# Patient Record
Sex: Female | Born: 1945 | Race: White | Hispanic: Yes | Smoking: Former smoker
Health system: Southern US, Community
[De-identification: ages and names within clinical notes are randomized; demographics above are authoritative.]

## PROBLEM LIST (undated history)

## (undated) DIAGNOSIS — M1710 Unilateral primary osteoarthritis, unspecified knee: Secondary | ICD-10-CM

## (undated) DIAGNOSIS — M8580 Other specified disorders of bone density and structure, unspecified site: Secondary | ICD-10-CM

## (undated) DIAGNOSIS — I10 Essential (primary) hypertension: Secondary | ICD-10-CM

## (undated) DIAGNOSIS — M1990 Unspecified osteoarthritis, unspecified site: Secondary | ICD-10-CM

## (undated) DIAGNOSIS — T7840XA Allergy, unspecified, initial encounter: Secondary | ICD-10-CM

## (undated) DIAGNOSIS — M179 Osteoarthritis of knee, unspecified: Secondary | ICD-10-CM

## (undated) DIAGNOSIS — IMO0001 Reserved for inherently not codable concepts without codable children: Secondary | ICD-10-CM

## (undated) DIAGNOSIS — K7460 Unspecified cirrhosis of liver: Secondary | ICD-10-CM

## (undated) DIAGNOSIS — B1920 Unspecified viral hepatitis C without hepatic coma: Secondary | ICD-10-CM

## (undated) DIAGNOSIS — E119 Type 2 diabetes mellitus without complications: Secondary | ICD-10-CM

## (undated) DIAGNOSIS — K219 Gastro-esophageal reflux disease without esophagitis: Secondary | ICD-10-CM

## (undated) HISTORY — DX: Other specified disorders of bone density and structure, unspecified site: M85.80

## (undated) HISTORY — DX: Gastro-esophageal reflux disease without esophagitis: K21.9

## (undated) HISTORY — DX: Reserved for inherently not codable concepts without codable children: IMO0001

## (undated) HISTORY — DX: Osteoarthritis of knee, unspecified: M17.9

## (undated) HISTORY — DX: Unspecified viral hepatitis C without hepatic coma: B19.20

## (undated) HISTORY — PX: KNEE SURGERY: SHX244

## (undated) HISTORY — DX: Allergy, unspecified, initial encounter: T78.40XA

## (undated) HISTORY — DX: Type 2 diabetes mellitus without complications: E11.9

## (undated) HISTORY — DX: Unilateral primary osteoarthritis, unspecified knee: M17.10

## (undated) HISTORY — DX: Unspecified osteoarthritis, unspecified site: M19.90

---

## 2006-12-09 LAB — HM PAP SMEAR

## 2007-01-08 LAB — HM COLONOSCOPY

## 2007-07-11 LAB — HM MAMMOGRAPHY

## 2007-07-15 ENCOUNTER — Encounter: Admission: RE | Admit: 2007-07-15 | Discharge: 2007-08-13 | Payer: Self-pay | Admitting: Family Medicine

## 2007-08-20 ENCOUNTER — Ambulatory Visit: Payer: Self-pay | Admitting: Gastroenterology

## 2007-09-16 ENCOUNTER — Encounter: Admission: RE | Admit: 2007-09-16 | Discharge: 2007-09-16 | Payer: Self-pay | Admitting: Family Medicine

## 2008-06-28 ENCOUNTER — Ambulatory Visit: Payer: Self-pay | Admitting: Gastroenterology

## 2008-06-30 ENCOUNTER — Ambulatory Visit (HOSPITAL_COMMUNITY): Admission: RE | Admit: 2008-06-30 | Discharge: 2008-06-30 | Payer: Self-pay | Admitting: Gastroenterology

## 2009-06-04 ENCOUNTER — Emergency Department (HOSPITAL_COMMUNITY): Admission: EM | Admit: 2009-06-04 | Discharge: 2009-06-04 | Payer: Self-pay | Admitting: Emergency Medicine

## 2011-03-18 ENCOUNTER — Encounter: Payer: Self-pay | Admitting: Family Medicine

## 2011-03-18 DIAGNOSIS — K219 Gastro-esophageal reflux disease without esophagitis: Secondary | ICD-10-CM | POA: Insufficient documentation

## 2011-03-18 DIAGNOSIS — M1710 Unilateral primary osteoarthritis, unspecified knee: Secondary | ICD-10-CM | POA: Insufficient documentation

## 2011-03-18 DIAGNOSIS — T7840XA Allergy, unspecified, initial encounter: Secondary | ICD-10-CM | POA: Insufficient documentation

## 2011-03-18 DIAGNOSIS — M8580 Other specified disorders of bone density and structure, unspecified site: Secondary | ICD-10-CM | POA: Insufficient documentation

## 2011-03-18 DIAGNOSIS — B1920 Unspecified viral hepatitis C without hepatic coma: Secondary | ICD-10-CM | POA: Insufficient documentation

## 2011-03-18 DIAGNOSIS — E119 Type 2 diabetes mellitus without complications: Secondary | ICD-10-CM | POA: Insufficient documentation

## 2012-04-16 ENCOUNTER — Ambulatory Visit: Payer: PRIVATE HEALTH INSURANCE | Attending: Family Medicine | Admitting: Physical Therapy

## 2012-04-16 DIAGNOSIS — R293 Abnormal posture: Secondary | ICD-10-CM | POA: Insufficient documentation

## 2012-04-16 DIAGNOSIS — IMO0001 Reserved for inherently not codable concepts without codable children: Secondary | ICD-10-CM | POA: Insufficient documentation

## 2012-04-16 DIAGNOSIS — R262 Difficulty in walking, not elsewhere classified: Secondary | ICD-10-CM | POA: Insufficient documentation

## 2012-04-16 DIAGNOSIS — M545 Low back pain, unspecified: Secondary | ICD-10-CM | POA: Insufficient documentation

## 2012-04-16 DIAGNOSIS — M25569 Pain in unspecified knee: Secondary | ICD-10-CM | POA: Insufficient documentation

## 2012-04-28 ENCOUNTER — Ambulatory Visit: Payer: PRIVATE HEALTH INSURANCE | Admitting: Physical Therapy

## 2012-04-30 ENCOUNTER — Ambulatory Visit: Payer: PRIVATE HEALTH INSURANCE | Admitting: Physical Therapy

## 2012-05-05 ENCOUNTER — Encounter: Payer: PRIVATE HEALTH INSURANCE | Admitting: Physical Therapy

## 2012-05-07 ENCOUNTER — Encounter: Payer: PRIVATE HEALTH INSURANCE | Admitting: Physical Therapy

## 2012-07-07 ENCOUNTER — Emergency Department (HOSPITAL_COMMUNITY): Payer: PRIVATE HEALTH INSURANCE

## 2012-07-07 ENCOUNTER — Encounter (HOSPITAL_COMMUNITY): Payer: Self-pay | Admitting: Cardiology

## 2012-07-07 ENCOUNTER — Observation Stay (HOSPITAL_COMMUNITY)
Admission: EM | Admit: 2012-07-07 | Discharge: 2012-07-08 | Disposition: A | Discharge status: Home / Self Care | Payer: PRIVATE HEALTH INSURANCE | Attending: Emergency Medicine | Admitting: Emergency Medicine

## 2012-07-07 DIAGNOSIS — R079 Chest pain, unspecified: Principal | ICD-10-CM | POA: Insufficient documentation

## 2012-07-07 DIAGNOSIS — B1920 Unspecified viral hepatitis C without hepatic coma: Secondary | ICD-10-CM | POA: Insufficient documentation

## 2012-07-07 DIAGNOSIS — Z7982 Long term (current) use of aspirin: Secondary | ICD-10-CM | POA: Insufficient documentation

## 2012-07-07 DIAGNOSIS — J309 Allergic rhinitis, unspecified: Secondary | ICD-10-CM | POA: Insufficient documentation

## 2012-07-07 DIAGNOSIS — I10 Essential (primary) hypertension: Secondary | ICD-10-CM | POA: Insufficient documentation

## 2012-07-07 DIAGNOSIS — F17200 Nicotine dependence, unspecified, uncomplicated: Secondary | ICD-10-CM | POA: Insufficient documentation

## 2012-07-07 DIAGNOSIS — M1710 Unilateral primary osteoarthritis, unspecified knee: Secondary | ICD-10-CM | POA: Insufficient documentation

## 2012-07-07 DIAGNOSIS — Z79899 Other long term (current) drug therapy: Secondary | ICD-10-CM | POA: Insufficient documentation

## 2012-07-07 DIAGNOSIS — M899 Disorder of bone, unspecified: Secondary | ICD-10-CM | POA: Insufficient documentation

## 2012-07-07 DIAGNOSIS — K219 Gastro-esophageal reflux disease without esophagitis: Secondary | ICD-10-CM | POA: Insufficient documentation

## 2012-07-07 DIAGNOSIS — IMO0002 Reserved for concepts with insufficient information to code with codable children: Secondary | ICD-10-CM | POA: Insufficient documentation

## 2012-07-07 DIAGNOSIS — E119 Type 2 diabetes mellitus without complications: Secondary | ICD-10-CM | POA: Insufficient documentation

## 2012-07-07 HISTORY — DX: Essential (primary) hypertension: I10

## 2012-07-07 LAB — POCT I-STAT TROPONIN I
Troponin i, poc: 0.03 ng/mL (ref 0.00–0.08)
Troponin i, poc: 0.01 ng/mL (ref 0.00–0.08)

## 2012-07-07 LAB — CBC WITH DIFFERENTIAL/PLATELET
Basophils Absolute: 0 10*3/uL (ref 0.0–0.1)
Basophils Relative: 0 % (ref 0–1)
Eosinophils Absolute: 0.1 10*3/uL (ref 0.0–0.7)
Eosinophils Relative: 2 % (ref 0–5)
HCT: 43.2 % (ref 36.0–46.0)
Hemoglobin: 14.6 g/dL (ref 12.0–15.0)
Lymphocytes Relative: 36 % (ref 12–46)
Lymphs Abs: 2.4 10*3/uL (ref 0.7–4.0)
MCH: 31.6 pg (ref 26.0–34.0)
MCHC: 33.8 g/dL (ref 30.0–36.0)
MCV: 93.5 fL (ref 78.0–100.0)
Monocytes Absolute: 0.5 10*3/uL (ref 0.1–1.0)
Monocytes Relative: 8 % (ref 3–12)
Neutro Abs: 3.5 10*3/uL (ref 1.7–7.7)
Neutrophils Relative %: 54 % (ref 43–77)
Platelets: 139 10*3/uL — ABNORMAL LOW (ref 150–400)
RBC: 4.62 MIL/uL (ref 3.87–5.11)
RDW: 13.7 % (ref 11.5–15.5)
WBC: 6.6 10*3/uL (ref 4.0–10.5)

## 2012-07-07 LAB — BASIC METABOLIC PANEL
BUN: 8 mg/dL (ref 6–23)
CO2: 29 mEq/L (ref 19–32)
Calcium: 9.6 mg/dL (ref 8.4–10.5)
Chloride: 101 mEq/L (ref 96–112)
Creatinine, Ser: 0.67 mg/dL (ref 0.50–1.10)
GFR calc Af Amer: >90 mL/min (ref >90)
GFR calc non Af Amer: >90 mL/min (ref >90)
Glucose, Bld: 162 mg/dL — ABNORMAL HIGH (ref 70–99)
Potassium: 4.5 mEq/L (ref 3.5–5.1)
Sodium: 137 mEq/L (ref 135–145)

## 2012-07-07 LAB — TROPONIN I: Troponin I: <0.3 ng/mL (ref <0.30)

## 2012-07-07 MED ORDER — PANTOPRAZOLE SODIUM 40 MG PO TBEC: 40.0000 mg | DELAYED_RELEASE_TABLET | Freq: Every day | ORAL | Status: DC

## 2012-07-07 MED ORDER — LISINOPRIL 10 MG PO TABS: 10.0000 mg | ORAL_TABLET | Freq: Every day | ORAL | Status: DC

## 2012-07-07 MED ORDER — OXYBUTYNIN CHLORIDE ER 5 MG PO TB24: 5.0000 mg | ORAL_TABLET | Freq: Every day | ORAL | Status: DC

## 2012-07-07 MED ORDER — LORATADINE 10 MG PO TABS: 10.0000 mg | ORAL_TABLET | Freq: Every day | ORAL | Status: DC

## 2012-07-07 MED ORDER — DULOXETINE HCL 60 MG PO CPEP: 60.0000 mg | ORAL_CAPSULE | Freq: Every day | ORAL | Status: DC

## 2012-07-07 MED ORDER — SODIUM CHLORIDE 0.9 % IV SOLN
20.0000 mL | INTRAVENOUS | Status: DC
  Administered 2012-07-07: 20 mL via INTRAVENOUS

## 2012-07-07 MED ORDER — ASPIRIN 81 MG PO CHEW
324.0000 mg | CHEWABLE_TABLET | Freq: Once | ORAL | Status: AC
  Administered 2012-07-07: 243 mg via ORAL
  Filled 2012-07-07: qty 3

## 2012-07-07 NOTE — ED Notes (Signed)
Called the IV team to start IV.

## 2012-07-07 NOTE — ED Provider Notes (Signed)
Patient placed in CDU under chest pain protocol.  Patient resting in bed with family at bedside.  Patient reports awakening at 0400 this am with substernal chest pain with radiation to left arm.  Patient continues to report aching pain to left arm, but is currently chest pain free.  Lungs CTA bilaterally.  S1/S2, RRR, no murmur noted.  Abdomen soft, bowel sounds present.  Strong distal pulses palpated all extremities.  Monitor reveals NSR without ectopy.  12 lead reviewed, no indication of ischemia.  Cardiac markers within normal limits.  Patient is scheduled for coronary CT in AM.  Treatment and diagnostic plan discussed with patient.  Jimmye Norman, NP 07/07/12 [phone]

## 2012-07-07 NOTE — ED Notes (Signed)
Pt given turkey sandwich and sprite.  

## 2012-07-07 NOTE — ED Provider Notes (Addendum)
History    66 year old female with chest pain. Woke her from sleep at approximately 4:00 this morning. Substernal ache which lasted about 5 minutes and slowly resolved without intervention. Associated with an achy feeling in her left arm. This sensation in her arm has persisted since then. No appreciable exacerbating/relieving factors. No shortness of breath. No palpitations or diaphoresis. No history similar symptoms. No unusual leg pain or swelling. No known history of coronary disease. Patient self support no previous provocative testing. Has never seen a cardiologist. History of hypertension, diabetes, and obesity. Patient is a smoker. No unusual leg pain or swelling. No fevers or chills. No cough.  CSN: [id number]  Arrival date & time 07/07/12  1548   First MD Initiated Contact with Patient 07/07/12 1634      Chief Complaint  Patient presents with  . Chest Pain    (Consider location/radiation/quality/duration/timing/severity/associated sxs/prior treatment) HPI  Past Medical History  Diagnosis Date  . GERD (gastroesophageal reflux disease)   . Allergy     Rhinitis  . Hepatitis C   . Osteopenia   . NIDDM (non-insulin dependent diabetes mellitus)   . Osteoarthritis of knee     right knee  . Hypertension     Past Surgical History  Procedure Date  . Knee surgery     History reviewed. No pertinent family history.  History  Substance Use Topics  . Smoking status: Current Some Day Smoker  . Smokeless tobacco: Not on file  . Alcohol Use: No    OB History    Grav Para Term Preterm Abortions TAB SAB Ect Mult Living                  Review of Systems   Review of symptoms negative unless otherwise noted in HPI.   Allergies  Review of patient's allergies indicates no known allergies.  Home Medications   Current Outpatient Rx  Name Route Sig Dispense Refill  . ASPIRIN 81 MG PO TABS Oral Take 81 mg by mouth daily.      . DULOXETINE HCL 60 MG PO CPEP Oral Take 60  mg by mouth daily.      Marland Kitchen LISINOPRIL 10 MG PO TABS Oral Take 10 mg by mouth daily.    Marland Kitchen LORATADINE 10 MG PO TABS Oral Take 10 mg by mouth daily.      Marland Kitchen OMEPRAZOLE 20 MG PO CPDR Oral Take 20 mg by mouth daily.      . OXYBUTYNIN CHLORIDE ER 5 MG PO TB24 Oral Take 5 mg by mouth daily.      BP 129/92  Pulse 99  Temp 98.9 F (37.2 C) (Oral)  Resp 16  Ht 5' (1.524 m)  Wt 176 lb (79.833 kg)  BMI 34.37 kg/m2  SpO2 99%  Physical Exam  Nursing note and vitals reviewed. Constitutional: She appears well-developed and well-nourished. No distress.       Sitting in bed. NAD. Obese.  HENT:  Head: Normocephalic and atraumatic.  Eyes: Conjunctivae normal are normal. Right eye exhibits no discharge. Left eye exhibits no discharge.  Neck: Neck supple.  Cardiovascular: Normal rate, regular rhythm and normal heart sounds.  Exam reveals no gallop and no friction rub.   No murmur heard. Pulmonary/Chest: Effort normal and breath sounds normal. No respiratory distress. She exhibits no tenderness.  Abdominal: Soft. She exhibits no distension. There is no tenderness.  Musculoskeletal: She exhibits no edema and no tenderness.       Lower extremities symmetric as compared  to each other. No calf tenderness. Negative Homan's. No palpable cords.   Neurological: She is alert.  Skin: Skin is warm and dry. She is not diaphoretic.  Psychiatric: She has a normal mood and affect. Her behavior is normal. Thought content normal.    ED Course  Procedures (including critical care time)  Labs Reviewed - No data to display Dg Chest 2 View  07/07/2012  *RADIOLOGY REPORT*  Clinical Data: Chest pain  CHEST - 2 VIEW  Comparison: None.  Findings: Probable small calcified granuloma in the left upper lobe. Lungs clear.  Heart size and pulmonary vascularity normal. No effusion.  Visualized bones unremarkable.  IMPRESSION: No acute disease   Original Report Authenticated By: Thora Lance III, M.D.     EKG:  Rhythm:  sinus tachycardia Vent. rate 102 BPM PR interval 120 ms QRS duration 70 ms QT/QTc 330/430 ms Axis: normal ST segments: NS ST changes inferiorly and laterally Comparison: none   1. Chest pain       MDM  66yF with CP and L arm pain. Arm pain possible anginal equivalent but atypical given constant duration since 0400 today. EKG with nonspecific changes inferiorly and laterally. Initial troponin is normal. Pt with no known CAD but no previous formal evaluation. Multiple risk factors for CAD including smoking, HTN, DM and obesity. Feel low enough risk for CP protocol. BMI 34.4. CT coronaries in am assuming rules out enzymatically. Discussed with Felicie Morn, PA, in CDU.        Raeford Razor, MD 07/07/12 [id number]  Raeford Razor, MD 07/08/12 0030

## 2012-07-07 NOTE — ED Notes (Signed)
Family at bedside. 

## 2012-07-07 NOTE — ED Notes (Signed)
Pt to department with c/o chest pain that happened last night and radiated into her left arm. States the nurse that comes to check on her was worried about possible stroke, but pt denies any neuro deficits or symptoms at all. Reports pain in her left arm at this time.

## 2012-07-08 ENCOUNTER — Observation Stay (HOSPITAL_COMMUNITY): Payer: PRIVATE HEALTH INSURANCE

## 2012-07-08 LAB — POCT I-STAT TROPONIN I: Troponin i, poc: 0.01 ng/mL (ref 0.00–0.08)

## 2012-07-08 IMAGING — CT CT HEART MORP W/ CTA COR W/ SCORE W/ CA W/CM &/OR W/O CM
1 series · 14 of 20 positions shown, 18 images · IV contrast (omnipaque)
Comparison: No priors.

INDICATION: 66-year-old female with history of chest pain.

CT ANGIOGRAPHY OF THE HEART, CORONARY ARTERY, STRUCTURE, AND
MORPHOLOGY
CONTRAST:  80 ml of Omnipaque 350.
TECHNIQUE: CT angiography of the coronary vessels was performed on
a 256 channel system using prospective ECG gating.  A scout and
noncontrast exam (for calcium scoring) were performed.  Circulation
time was measured using a test bolus.  Coronary CTA was performed
with sub mm slice collimation during portions of the cardiac cycle
after prior injection of iodinated contrast.  Imaging post
processing was performed on an independent workstation creating
multiplanar and 3-D images, and quantitative analysis of the heart
and coronary arteries.  Note that this exam targets the heart and
the chest was not imaged in its entirety.
PREMEDICATION:
Lopressor 200 mg, P.O.
Nitroglycerin 400 mcg, sublingual.

[Series 335: ccta · 14 of 24 slices shown, 18 images]
[im 2/24  vessel]
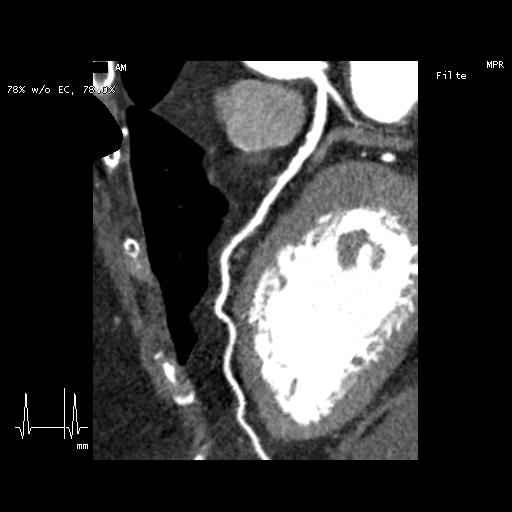
[im 2/24  lung]
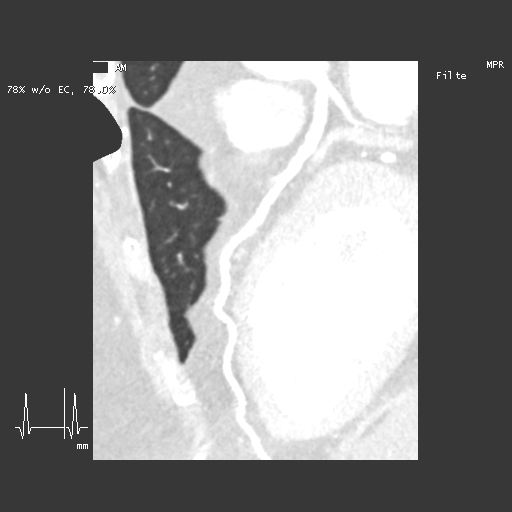
[im 3/24  vessel]
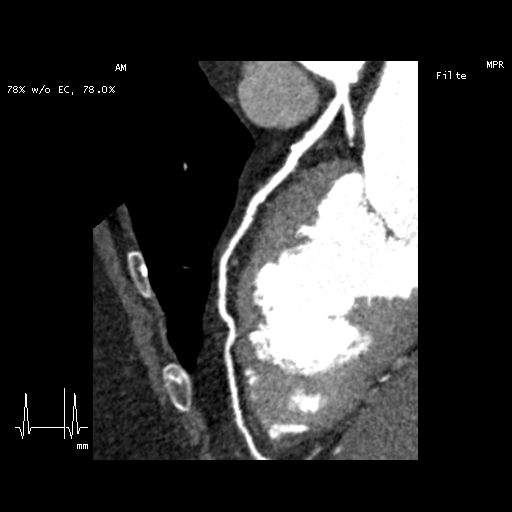
[im 5/24  vessel]
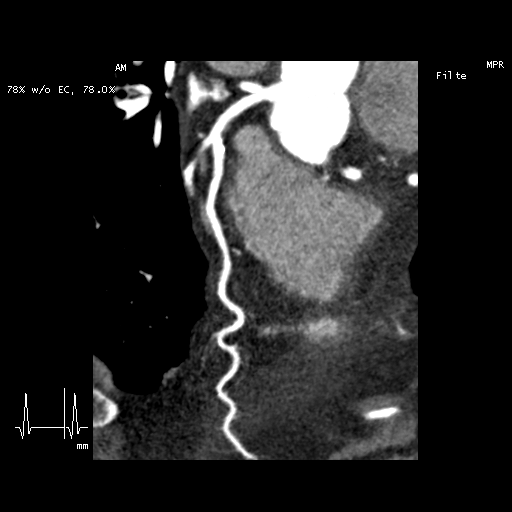
[im 7/24  vessel]
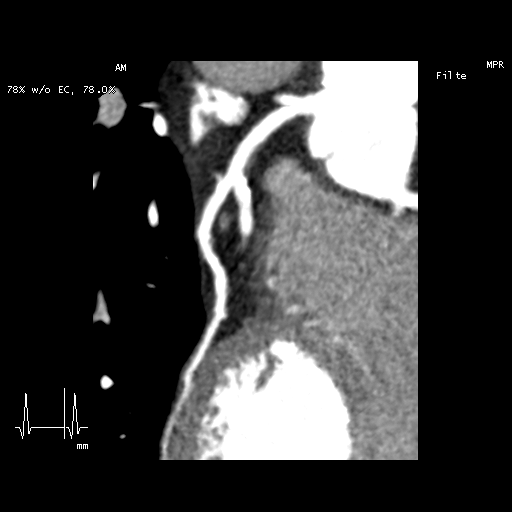
[im 8/24  vessel]
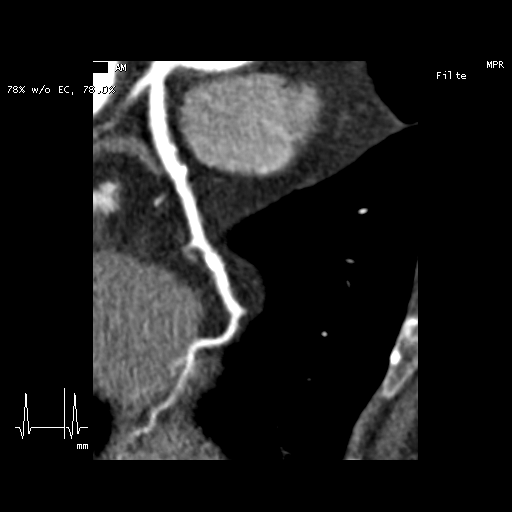
[im 8/24  lung]
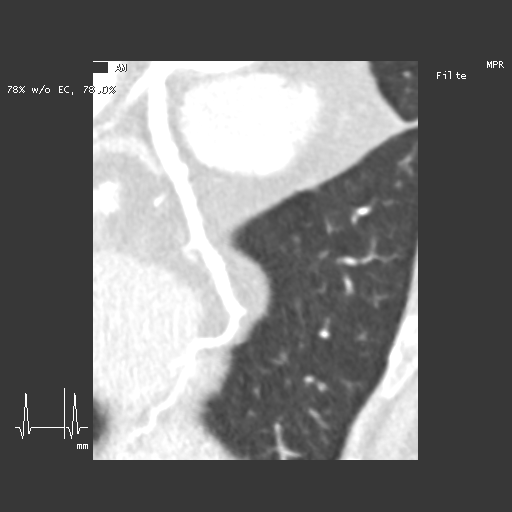
[im 10/24  vessel]
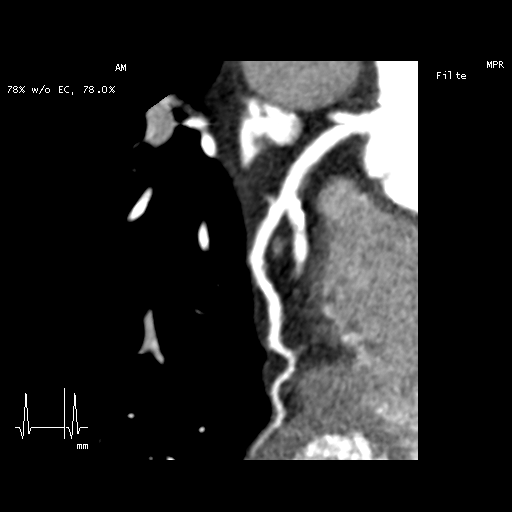
[im 11/24  vessel]
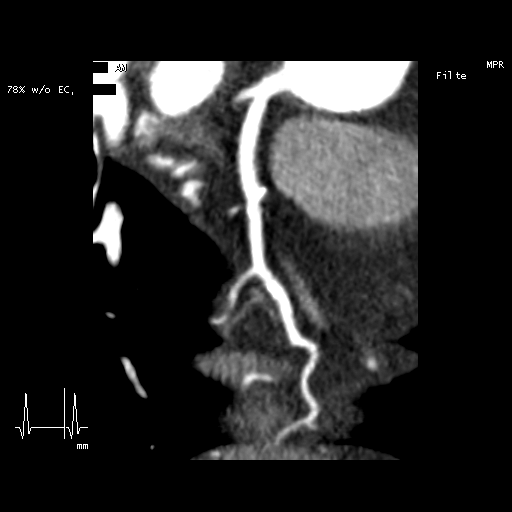
[im 13/24  vessel]
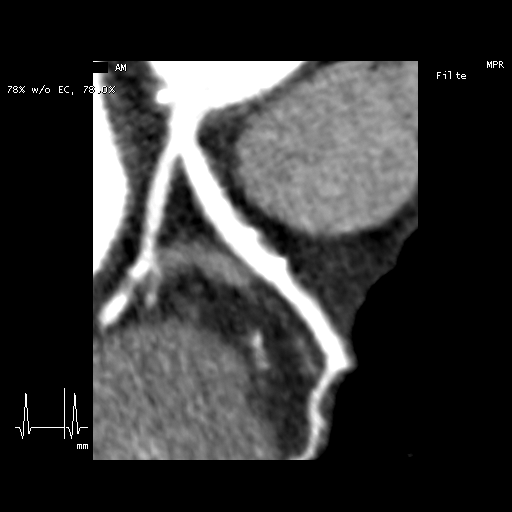
[im 14/24  vessel]
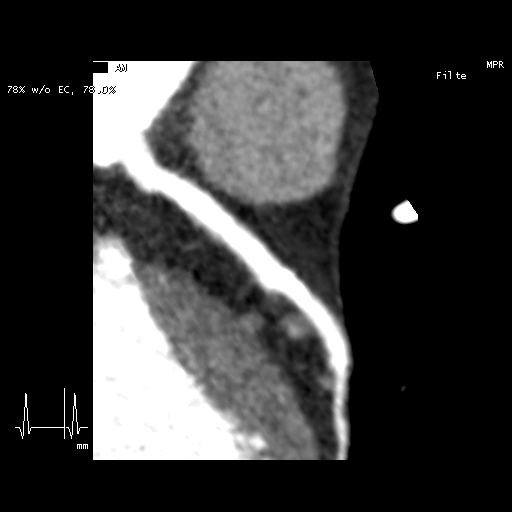
[im 14/24  lung]
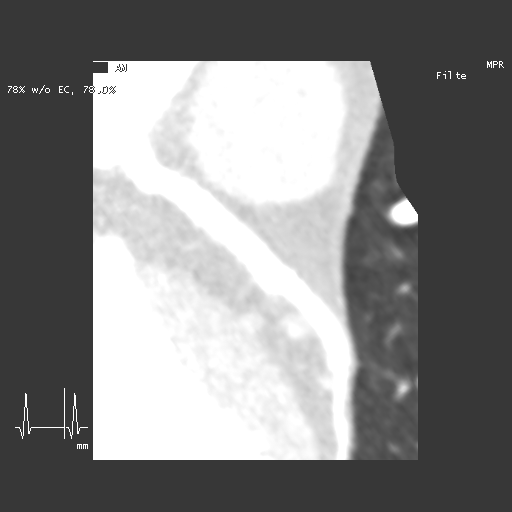
[im 16/24  vessel]
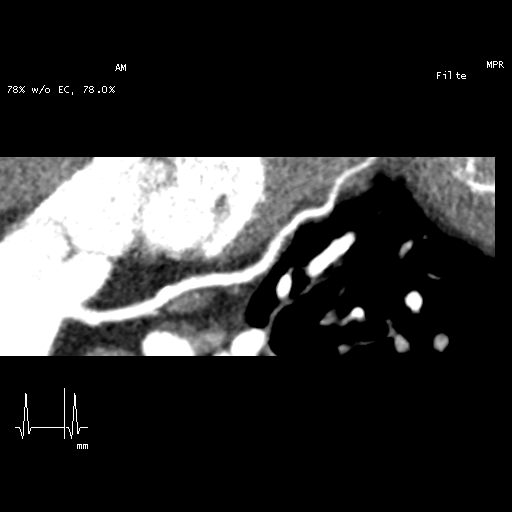
[im 17/24  vessel]
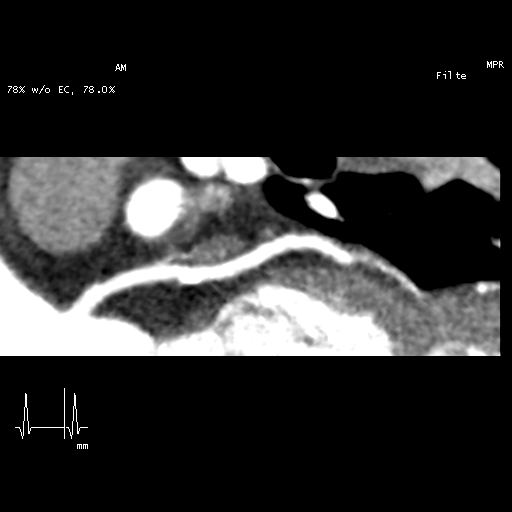
[im 19/24  vessel]
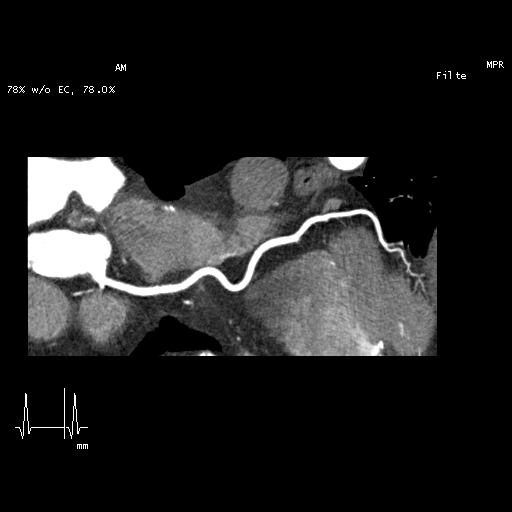
[im 21/24  vessel]
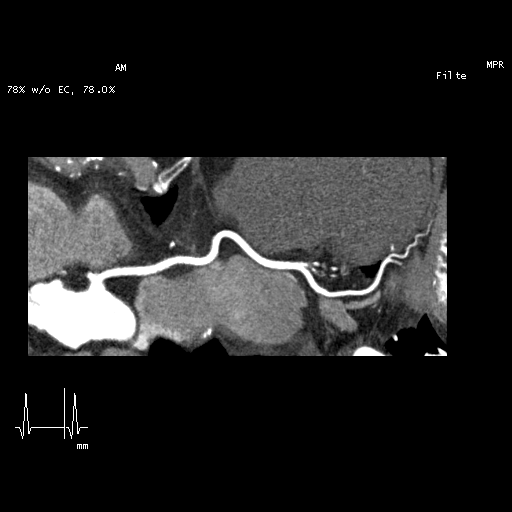
[im 21/24  lung]
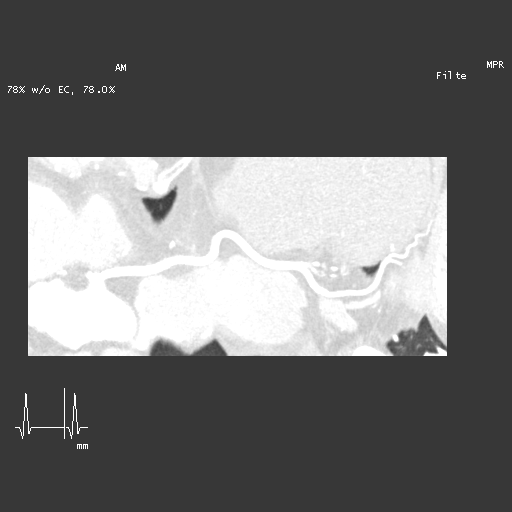
[im 22/24  vessel]
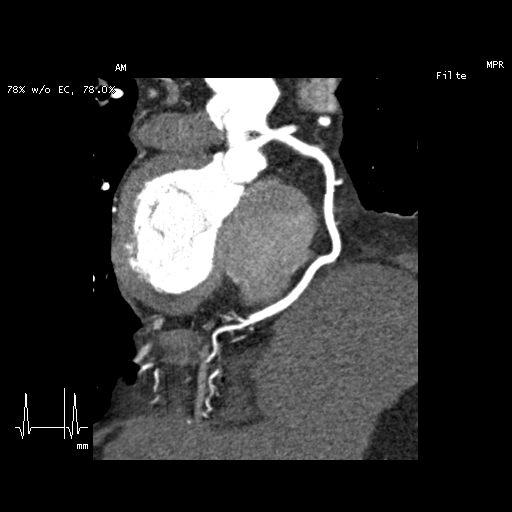

[14 of 20 positions shown; findings below may reference images not displayed]

FINDINGS: Technical quality:  Excellent

Heart rate:  45

CORONARY ARTERIES:
Left main coronary artery:  Negative.
Left anterior descending:  Mildly diseased with mixed
atherosclerotic plaque resulting in up to 25-50% percent stenosis
in the mid LAD just distal to the first septal perforator.
Diagonals: Large diagonal 1, negative for atherosclerosis.
Left circumflex:  Minimal calcified plaque proximally with 0-25%
stenosis.
Obtuse Marginals: Small OM1 and large OM2, both negative for
atherosclerosis.
Right coronary artery:  Negative.
Posterior descending artery:  Negative.
Dominance:  Right.

CORONARY CALCIUM:
Total Agatston Score:  160
[HOSPITAL] percentile:  92nd

AORTA AND PULMONARY MEASUREMENTS:
Aortic root (21 - 40 mm):
            20 mm  at the annulus
            26 mm  at the sinuses of Valsalva
            22 mm  at the sinotubular junction
Ascending aorta ( <  40 mm):  29 mm

Descending aorta ( <  40 mm):  21 mm
Main pulmonary artery:  ( <  30 mm):  25 mm

EXTRACARDIAC FINDINGS:
Linear opacities in the lower lobes bilaterally, inferior segment
of the lingula and right middle lobe are compatible with areas of
subsegmental atelectasis and/or scarring.  Within the visualized
thorax there is no acute consolidative air space disease, no
pleural effusions, and no suspicious-appearing pulmonary nodules or
masses. There is no significant pericardial fluid, thickening or
pericardial calcification.  Visualized portions of the upper
abdomen are remarkable for a shrunken appearance and nodular
contour of the liver, consistent with cirrhosis. There are no
aggressive appearing lytic or blastic lesions noted in the
visualized portions of the skeleton.
IMPRESSION: 1.  Nonobstructive coronary artery disease, as detailed above.  The
patient's total coronary artery calcium score is 160, which is 92nd
percentile for patient's of matched age, gender and race/ethnicity.
2.  No acute findings in the visualized thorax that of the
patient's symptoms.
3.  Multifocal areas of subsegmental atelectasis and/or scarring in
the visualized lungs bilaterally, as above.
4.  Cirrhosis.
5.  Right coronary artery dominance.

07/08/2012.

## 2012-07-08 MED ORDER — METOPROLOL TARTRATE 1 MG/ML IV SOLN
INTRAVENOUS | Status: AC
  Filled 2012-07-08: qty 10

## 2012-07-08 MED ORDER — METOPROLOL TARTRATE 1 MG/ML IV SOLN: 5.0000 mg | Freq: Once | INTRAVENOUS | Status: DC

## 2012-07-08 MED ORDER — IOHEXOL 350 MG/ML SOLN
80.0000 mL | Freq: Once | INTRAVENOUS | Status: AC | PRN
  Administered 2012-07-08: 80 mL via INTRAVENOUS

## 2012-07-08 MED ORDER — METOPROLOL TARTRATE 25 MG PO TABS
100.0000 mg | ORAL_TABLET | Freq: Once | ORAL | Status: AC
  Administered 2012-07-08: 100 mg via ORAL
  Filled 2012-07-08: qty 4

## 2012-07-08 MED ORDER — NITROGLYCERIN 0.4 MG SL SUBL
0.4000 mg | SUBLINGUAL_TABLET | Freq: Once | SUBLINGUAL | Status: AC
  Administered 2012-07-08: 0.4 mg via SUBLINGUAL

## 2012-07-08 MED ORDER — METOPROLOL TARTRATE 1 MG/ML IV SOLN
INTRAVENOUS | Status: AC
  Filled 2012-07-08: qty 5

## 2012-07-08 MED ORDER — METOPROLOL TARTRATE 25 MG PO TABS
100.0000 mg | ORAL_TABLET | Freq: Once | ORAL | Status: AC
  Administered 2012-07-08: 100 mg via ORAL
  Filled 2012-07-08: qty 1
  Filled 2012-07-08: qty 3

## 2012-07-08 NOTE — ED Notes (Signed)
Pt taken to CT by RN for CTA

## 2012-07-08 NOTE — ED Notes (Signed)
Per Dr Rulon Abide hold metoprolol for CTA  due to pressure, CT notified

## 2012-07-08 NOTE — ED Notes (Signed)
NAD noted at time of d/c home with family 

## 2012-07-08 NOTE — ED Notes (Signed)
Pt returned from CT and on monitor

## 2012-07-08 NOTE — ED Provider Notes (Signed)
Patient moved to CDU under chest pain protocol. Patient resting comfortably at present without return of chest pain. Lungs CTA bilaterally. S1/S2, RRR, no murmur. Abdomen soft, bowel sounds present. Strong distal pulses palpated all extremities. Sinus rhythm on monitor without ectopy. Troponin negative x 2. 12 lead reviewed, no indication of ischemia. Patient scheduled for CT coronary this AM. Diagnostic and treatment plan discussed with patient.  8:00AM  Ca score of 160, non obstructing LAD: Mildly diseased with mixed atherosclerotic plaque resulting in up to 25-50% percent stenosis in the mid LAD just distal to the first septal perforator.  Of Note: Visualized portions of the upper abdomen are remarkable for a shrunken appearance and nodular contour of the liver, consistent with cirrhosis  12:00PM  Pt to be d/c home with outpatient cardiology f/u.  I have discussed these findings with the patient.  Patient is to be discharged with recommendation to follow up with PCP in regards to today's hospital visit. Pts symptoms unlikely to be of CAD etiology and pt has not reported any CP while in my care in the CDU. Labs and imaging reviewed again prior to dc. CXR and ECG with no acute abnormalities, CTangio without evidence of obstructing lesion as noted above, and neg troponins x3. Pt has been advised return to the ED if develop any exertional CP- strict return precautions discussed & patient's questions answered. Pt appears reliable for follow up and is agreeable to discharge.   Dahlia Client Orris Perin, PA-C 07/08/12 1219

## 2012-07-08 NOTE — ED Provider Notes (Signed)
Medical screening examination/treatment/procedure(s) were performed by non-physician practitioner and as supervising physician I was immediately available for consultation/collaboration.  Gerhard Munch, MD 07/08/12 [phone]

## 2012-07-08 NOTE — ED Notes (Signed)
Pt notified that she is now NPO, drinks removed from room.

## 2012-07-08 NOTE — ED Notes (Signed)
Pt ambulatory to bathroom

## 2012-07-09 NOTE — ED Provider Notes (Signed)
Medical screening examination/treatment/procedure(s) were performed by non-physician practitioner and as supervising physician I was immediately available for consultation/collaboration.  Vang Kraeger, MD 07/09/12 0010 

## 2012-07-16 ENCOUNTER — Other Ambulatory Visit: Payer: Self-pay | Admitting: Cardiovascular Disease

## 2012-07-16 DIAGNOSIS — M79602 Pain in left arm: Secondary | ICD-10-CM

## 2012-07-20 ENCOUNTER — Inpatient Hospital Stay: Admission: RE | Admit: 2012-07-20 | Payer: PRIVATE HEALTH INSURANCE | Source: Ambulatory Visit

## 2012-08-18 ENCOUNTER — Other Ambulatory Visit: Payer: Self-pay | Admitting: Nurse Practitioner

## 2012-08-18 DIAGNOSIS — M542 Cervicalgia: Secondary | ICD-10-CM

## 2012-08-23 ENCOUNTER — Other Ambulatory Visit: Payer: PRIVATE HEALTH INSURANCE

## 2012-08-27 ENCOUNTER — Other Ambulatory Visit (HOSPITAL_COMMUNITY): Payer: Self-pay | Admitting: Cardiovascular Disease

## 2012-08-27 DIAGNOSIS — R079 Chest pain, unspecified: Secondary | ICD-10-CM

## 2012-09-09 HISTORY — PX: NM MYOCAR PERF WALL MOTION: HXRAD629

## 2012-09-09 HISTORY — PX: TRANSTHORACIC ECHOCARDIOGRAM: SHX275

## 2012-09-16 ENCOUNTER — Ambulatory Visit (HOSPITAL_COMMUNITY)
Admission: RE | Admit: 2012-09-16 | Discharge: 2012-09-16 | Disposition: A | Discharge status: Home / Self Care | Payer: PRIVATE HEALTH INSURANCE | Source: Ambulatory Visit | Attending: Cardiovascular Disease | Admitting: Cardiovascular Disease

## 2012-09-16 DIAGNOSIS — R079 Chest pain, unspecified: Secondary | ICD-10-CM

## 2012-09-16 DIAGNOSIS — R42 Dizziness and giddiness: Secondary | ICD-10-CM | POA: Insufficient documentation

## 2012-09-16 DIAGNOSIS — E119 Type 2 diabetes mellitus without complications: Secondary | ICD-10-CM | POA: Insufficient documentation

## 2012-09-16 DIAGNOSIS — I10 Essential (primary) hypertension: Secondary | ICD-10-CM | POA: Insufficient documentation

## 2012-09-16 MED ORDER — TECHNETIUM TC 99M SESTAMIBI GENERIC - CARDIOLITE
30.2000 | Freq: Once | INTRAVENOUS | Status: AC | PRN
  Administered 2012-09-16: 30.2 via INTRAVENOUS

## 2012-09-16 MED ORDER — TECHNETIUM TC 99M SESTAMIBI GENERIC - CARDIOLITE
11.0000 | Freq: Once | INTRAVENOUS | Status: AC | PRN
  Administered 2012-09-16: 11 via INTRAVENOUS

## 2012-09-16 MED ORDER — REGADENOSON 0.4 MG/5ML IV SOLN
0.4000 mg | Freq: Once | INTRAVENOUS | Status: AC
  Administered 2012-09-16: 0.4 mg via INTRAVENOUS

## 2012-09-16 NOTE — Progress Notes (Signed)
[facility]   2D echo completed 09/16/2012.   Cindy Caitlyne Ingham, RDCS   

## 2012-09-16 NOTE — Procedures (Addendum)
[facility] [address] [place] [address] [phone]  Cardiology Nuclear Med Study  Stacy Richard is a 67 y.o. female     MRN : [id number]     DOB: 1946-02-01  Procedure Date: 09/16/2012  Nuclear Med Background Indication for Stress Test:  Evaluation for Ischemia History:  no prior cardiac history Cardiac Risk Factors: Family History - CAD, History of Smoking, Hypertension, NIDDM and Obesity  Symptoms:  Chest Pain and Dizziness   Nuclear Pre-Procedure Caffeine/Decaff Intake:  12:00am NPO After: 10:00AM   IV Site: R Antecubital  IV 0.9% NS with Angio Cath:  22g  Chest Size (in):  n/a IV Started by: Stacy Richard, CNMT  Height: 5' (1.524 m)  Cup Size: C  BMI:  Body mass index is 35.74 kg/(m^2). Weight:  183 lb (83.008 kg)   Tech Comments:  n/a    Nuclear Med Study 1 or 2 day study: 1 day  Stress Test Type:  Lexiscan  Order Authorizing Provider:  Susa Griffins, MD   Resting Radionuclide: Technetium 3m Sestamibi  Resting Radionuclide Dose: 11.0 mCi   Stress Radionuclide:  Technetium 6m Sestamibi  Stress Radionuclide Dose: 30.2 mCi           Stress Protocol Rest HR: 62 Stress HR:82  Rest BP: 129/85 Stress BP: 110/71  Exercise Time (min): n/a METS: n/a          Dose of Adenosine (mg):  n/a Dose of Lexiscan: 0.4 mg  Dose of Atropine (mg): n/a Dose of Dobutamine: n/a mcg/kg/min (at max HR)  Stress Test Technologist: Stacy Richard, CCT Nuclear Technologist: Stacy Richard, CNMT   Rest Procedure:  Myocardial perfusion imaging was performed at rest 45 minutes following the intravenous administration of Technetium 82m Sestamibi. Stress Procedure:  The patient received IV Lexiscan 0.4 mg over 15-seconds.  Technetium 88m Sestamibi injected at 30-seconds.  There were no significant changes with Lexiscan.  Quantitative spect images were obtained after a 45 minute delay.  Transient Ischemic Dilatation (Normal  <1.22):  1.11 Lung/Heart Ratio (Normal <0.45):  0.29 QGS EDV:  51 ml QGS ESV:  14 ml LV Ejection Fraction: 72%  Signed by     Rest ECG: NSR - Normal EKG  Stress ECG: No significant change from baseline ECG  QPS Raw Data Images:  Normal; no motion artifact; normal heart/lung ratio. Stress Images:  Normal homogeneous uptake with minimal breast attenuation. Rest Images:  Comparison with the stress images reveals no significant change. Subtraction (SDS):  Normal  Impression Exercise Capacity:  Lexiscan with no exercise. BP Response:  Normal blood pressure response. Clinical Symptoms:  No significant symptoms noted. ECG Impression:  No significant ST segment change suggestive of ischemia. Comparison with Prior Nuclear Study: No previous nuclear study performed  Overall Impression:  Normal stress nuclear study. Low risk stress nuclear study.  LV Wall Motion:  NL LV Function, EF 72%; NL Wall Motion   [name],[name], MD  09/16/2012 5:17 PM

## 2012-10-30 ENCOUNTER — Other Ambulatory Visit: Payer: Self-pay | Admitting: *Deleted

## 2012-10-30 ENCOUNTER — Other Ambulatory Visit: Payer: Self-pay | Admitting: Cardiovascular Disease

## 2012-10-30 ENCOUNTER — Other Ambulatory Visit: Payer: Self-pay | Admitting: Internal Medicine

## 2012-10-30 ENCOUNTER — Ambulatory Visit
Admission: RE | Admit: 2012-10-30 | Discharge: 2012-10-30 | Disposition: A | Discharge status: Home / Self Care | Payer: PRIVATE HEALTH INSURANCE | Source: Ambulatory Visit | Attending: *Deleted | Admitting: *Deleted

## 2012-10-30 DIAGNOSIS — B182 Chronic viral hepatitis C: Secondary | ICD-10-CM

## 2012-11-02 ENCOUNTER — Other Ambulatory Visit: Payer: Self-pay | Admitting: Internal Medicine

## 2012-11-02 DIAGNOSIS — B182 Chronic viral hepatitis C: Secondary | ICD-10-CM

## 2013-01-05 ENCOUNTER — Encounter: Payer: Self-pay | Admitting: Cardiovascular Disease

## 2013-02-15 ENCOUNTER — Other Ambulatory Visit: Payer: Self-pay | Admitting: Internal Medicine

## 2013-02-15 DIAGNOSIS — C220 Liver cell carcinoma: Secondary | ICD-10-CM

## 2013-02-19 ENCOUNTER — Ambulatory Visit
Admission: RE | Admit: 2013-02-19 | Discharge: 2013-02-19 | Disposition: A | Discharge status: Home / Self Care | Payer: PRIVATE HEALTH INSURANCE | Source: Ambulatory Visit | Attending: Internal Medicine | Admitting: Internal Medicine

## 2013-02-19 DIAGNOSIS — C220 Liver cell carcinoma: Secondary | ICD-10-CM

## 2013-02-19 MED ORDER — IOHEXOL 300 MG/ML  SOLN
125.0000 mL | Freq: Once | INTRAMUSCULAR | Status: AC | PRN
  Administered 2013-02-19: 125 mL via INTRAVENOUS

## 2013-05-17 ENCOUNTER — Other Ambulatory Visit: Payer: Self-pay | Admitting: Physician Assistant

## 2013-05-17 DIAGNOSIS — E2839 Other primary ovarian failure: Secondary | ICD-10-CM

## 2013-07-06 ENCOUNTER — Other Ambulatory Visit: Payer: Self-pay | Admitting: Physician Assistant

## 2013-07-06 DIAGNOSIS — N6489 Other specified disorders of breast: Secondary | ICD-10-CM

## 2013-07-22 ENCOUNTER — Ambulatory Visit
Admission: RE | Admit: 2013-07-22 | Discharge: 2013-07-22 | Disposition: A | Discharge status: Home / Self Care | Payer: PRIVATE HEALTH INSURANCE | Source: Ambulatory Visit | Attending: Physician Assistant | Admitting: Physician Assistant

## 2013-07-22 DIAGNOSIS — N6489 Other specified disorders of breast: Secondary | ICD-10-CM

## 2013-08-09 ENCOUNTER — Other Ambulatory Visit: Payer: Self-pay | Admitting: *Deleted

## 2013-08-09 MED ORDER — PANTOPRAZOLE SODIUM 40 MG PO TBEC: 40.0000 mg | DELAYED_RELEASE_TABLET | Freq: Every day | ORAL | Status: DC

## 2013-10-07 ENCOUNTER — Other Ambulatory Visit: Payer: PRIVATE HEALTH INSURANCE

## 2013-10-13 ENCOUNTER — Other Ambulatory Visit: Payer: Self-pay | Admitting: *Deleted

## 2013-10-13 MED ORDER — TRIAMTERENE-HCTZ 37.5-25 MG PO CAPS: 1.0000 | ORAL_CAPSULE | Freq: Every day | ORAL | Status: DC

## 2013-10-13 NOTE — Telephone Encounter (Signed)
Left VM for patient to clarify medication.   Was not taking benzapril 20mg  at last OV with Dr. Rollene Fare in 09/2012 Lisinopril was increased to 20mg  daily at this visit it appears

## 2013-10-13 NOTE — Telephone Encounter (Signed)
Dyazide 37.5-25 refills #30 with 1 refill

## 2013-10-15 ENCOUNTER — Ambulatory Visit
Admission: RE | Admit: 2013-10-15 | Discharge: 2013-10-15 | Disposition: A | Discharge status: Home / Self Care | Payer: PRIVATE HEALTH INSURANCE | Source: Ambulatory Visit | Attending: Physician Assistant | Admitting: Physician Assistant

## 2013-10-15 DIAGNOSIS — E2839 Other primary ovarian failure: Secondary | ICD-10-CM

## 2013-10-18 NOTE — Telephone Encounter (Signed)
Spoke with patient to clarify medications.   Per last OV note in 09/2012 with Dr. Rollene Fare Lisinopril was increased to 20mg  daily.   Per patient, has been taking Lisinopril 20mg  and Benazepril 20mg  daily (refill request was for Benazepril)  RN spoke with Erasmo Downer, PharmD on what to do with refill and she recommended not refilled Benazepril and having patient take 2 - 20mg  tablets of Lisinopril and make OV.   RN informed patient of this information. Patient agreeable. Message sent to scheduler to set up [place].

## 2013-10-19 ENCOUNTER — Other Ambulatory Visit: Payer: Self-pay | Admitting: Internal Medicine

## 2013-10-19 DIAGNOSIS — B182 Chronic viral hepatitis C: Secondary | ICD-10-CM

## 2013-10-21 ENCOUNTER — Ambulatory Visit
Admission: RE | Admit: 2013-10-21 | Discharge: 2013-10-21 | Disposition: A | Discharge status: Home / Self Care | Payer: PRIVATE HEALTH INSURANCE | Source: Ambulatory Visit | Attending: Internal Medicine | Admitting: Internal Medicine

## 2013-10-21 DIAGNOSIS — B182 Chronic viral hepatitis C: Secondary | ICD-10-CM

## 2013-10-21 NOTE — Telephone Encounter (Signed)
Entry in error

## 2013-10-28 ENCOUNTER — Encounter: Payer: Self-pay | Admitting: *Deleted

## 2013-10-28 ENCOUNTER — Other Ambulatory Visit: Payer: Self-pay | Admitting: Internal Medicine

## 2013-10-28 DIAGNOSIS — K769 Liver disease, unspecified: Secondary | ICD-10-CM

## 2013-10-29 ENCOUNTER — Ambulatory Visit (INDEPENDENT_AMBULATORY_CARE_PROVIDER_SITE_OTHER): Payer: PRIVATE HEALTH INSURANCE | Admitting: Internal Medicine

## 2013-10-29 ENCOUNTER — Encounter: Payer: Self-pay | Admitting: Internal Medicine

## 2013-10-29 VITALS — BP 142/82 | HR 93 | Ht 59.0 in | Wt 175.5 lb

## 2013-10-29 DIAGNOSIS — B1920 Unspecified viral hepatitis C without hepatic coma: Secondary | ICD-10-CM

## 2013-10-29 DIAGNOSIS — I2510 Atherosclerotic heart disease of native coronary artery without angina pectoris: Secondary | ICD-10-CM

## 2013-10-29 DIAGNOSIS — I10 Essential (primary) hypertension: Secondary | ICD-10-CM

## 2013-10-29 DIAGNOSIS — E119 Type 2 diabetes mellitus without complications: Secondary | ICD-10-CM

## 2013-10-29 MED ORDER — METOPROLOL TARTRATE 25 MG PO TABS: 12.5000 mg | ORAL_TABLET | Freq: Two times a day (BID) | ORAL | Status: DC

## 2013-10-29 NOTE — Progress Notes (Signed)
OFFICE NOTE  Chief Complaint:  No complaints, New cardiologist  Primary Care Physician: Chesley Noon, MD  HPI:  Stacy Richard is a pleasant 68 year old female previously followed by Dr. Rollene Fare. She is here to establish new cardiac care. She also has a history of hepatitis C who is contemplating treatment given the new medications available. She also has some cirrhosis of the liver and possibly may have a malignancy. She scheduled for a CT and possibly biopsy later this week. She does have a history of hypertension and coronary artery disease. In 2013 she had a CT angiogram which showed a calcium score of 160, with LAD and circumflex calcium that was nonobstructive. She has been taking lisinopril for blood pressure as well as enalapril mistakenly.  She is asymptomatic and denies any chest pain or shortness of breath. Her heart rate is noted to be elevated. She has diabetes as well.  PMHx:  Past Medical History  Diagnosis Date  . GERD (gastroesophageal reflux disease)   . Allergy     Rhinitis  . Hepatitis C   . Osteopenia   . NIDDM (non-insulin dependent diabetes mellitus)   . Osteoarthritis of knee     right knee  . Hypertension   . Arthritis     left hip    Past Surgical History  Procedure Laterality Date  . Knee surgery    . Transthoracic echocardiogram  09/2012    EF 55-54%, mild LVH & mild conc hypertrophy, grade 1 diastolic dysfunction; mild MR  . Nm myocar perf wall motion  09/2012    lexiscan myoview; normal study, low risk    FAMHx:  Family History  Problem Relation Age of Onset  . Heart failure Mother   . Hepatitis Mother   . Kidney cancer Mother   . Heart disease Father   . Heart Problems Sister     murmur  . Diabetes type II Sister   . Heart Problems Daughter     murmur    SOCHx:   reports that she quit smoking about 4 years ago. She has never used smokeless tobacco. She reports that she uses illicit drugs (Marijuana). She reports that she does  not drink alcohol.  ALLERGIES:  No Known Allergies  ROS: A comprehensive review of systems was negative except for: Hepatitis C  HOME MEDS: Current Outpatient Prescriptions  Medication Sig Dispense Refill  . Artificial Tear Ointment (ARTIFICIAL TEARS) ointment as needed.      Marland Kitchen aspirin 81 MG tablet Take 81 mg by mouth daily.        . Biotin (BIOTIN 5000) 5 MG CAPS Take 1 capsule by mouth daily.      . Cholecalciferol (VITAMIN D-3) 1000 UNITS CAPS Take 1 capsule by mouth daily.      Marland Kitchen desonide (DESOWEN) 0.05 % lotion Apply 1 application topically 2 (two) times daily as needed.      . DULoxetine (CYMBALTA) 60 MG capsule Take 60 mg by mouth daily.        Marland Kitchen erythromycin ophthalmic ointment Place 1 application into both eyes at bedtime.      Marland Kitchen lisinopril (PRINIVIL,ZESTRIL) 20 MG tablet Take 40 mg by mouth daily.      . meloxicam (MOBIC) 15 MG tablet Take 15 mg by mouth daily.      . Omega-3 Fatty Acids (FISH OIL) 1000 MG CAPS Take 1 capsule by mouth daily.      Marland Kitchen oxybutynin (DITROPAN-XL) 5 MG 24 hr tablet Take 5 mg  by mouth daily.      . pantoprazole (PROTONIX) 40 MG tablet Take 1 tablet (40 mg total) by mouth daily.  30 tablet  3  . Polyethyl Glycol-Propyl Glycol (SYSTANE OP) Apply to eye daily.      Marland Kitchen triamterene-hydrochlorothiazide (DYAZIDE) 37.5-25 MG per capsule Take 1 capsule by mouth daily.       . metoprolol tartrate (LOPRESSOR) 25 MG tablet Take 0.5 tablets (12.5 mg total) by mouth 2 (two) times daily.  30 tablet  6   No current facility-administered medications for this visit.    LABS/IMAGING: No results found for this or any previous visit (from the past 48 hour(s)). No results found.  VITALS: BP 142/82  Pulse 93  Ht 4\' 11"  (1.499 m)  Wt 175 lb 8 oz (79.606 kg)  BMI 35.43 kg/m2  EXAM: General appearance: alert and no distress Neck: no carotid bruit, no JVD and thyroid not enlarged, symmetric, no tenderness/mass/nodules Lungs: clear to auscultation bilaterally Heart:  regular rate and rhythm, S1, S2 normal, no murmur, click, rub or gallop Abdomen: soft, non-tender; bowel sounds normal; no masses,  no organomegaly Extremities: extremities normal, atraumatic, no cyanosis or edema Pulses: 2+ and symmetric Skin: Skin color, texture, turgor normal. No rashes or lesions Neurologic: Grossly normal Psych: Mood, affect normal  EKG: Normal sinus rhythm at 93  ASSESSMENT: 1. Mild to moderate nonobstructive coronary disease 2. Hypertension 3. Tachycardia 4. Diabetes type 2 5. Hepatitis C  PLAN: 1.   Stacy Richard has mild to moderate nonobstructive coronary disease. Her hypertension is fairly well controlled. Unfortunately she was taking 2 different ACE inhibitors by mistake. We've corrected that and recommended she take lisinopril 40 mg daily. She should also be on a beta blocker given her history of coronary disease and persistent tachycardia. I recommended metoprolol tartrate 12.5 milligrams twice daily. I've encouraged her to continue to followup about her hepatitis and further liver checks. We'll see her back in 6 months.  Pixie Casino, MD, [facility] Attending Cardiologist [facility]  Wednesday Ericsson C 10/29/2013, 4:13 PM

## 2013-10-29 NOTE — Patient Instructions (Signed)
Your physician has recommended you make the following change in your medication: START metoprolol tartrate 12.5mg twice daily.   Your physician wants you to follow-up in: 6 months.  You will receive a reminder letter in the mail two months in advance. If you don't receive a letter, please call our office to schedule the follow-up appointment.   

## 2013-11-01 ENCOUNTER — Other Ambulatory Visit: Payer: Self-pay | Admitting: *Deleted

## 2013-11-03 ENCOUNTER — Ambulatory Visit
Admission: RE | Admit: 2013-11-03 | Discharge: 2013-11-03 | Disposition: A | Discharge status: Home / Self Care | Payer: PRIVATE HEALTH INSURANCE | Source: Ambulatory Visit | Attending: Internal Medicine | Admitting: Internal Medicine

## 2013-11-03 DIAGNOSIS — K769 Liver disease, unspecified: Secondary | ICD-10-CM

## 2013-11-03 MED ORDER — IOHEXOL 300 MG/ML  SOLN
100.0000 mL | Freq: Once | INTRAMUSCULAR | Status: AC | PRN
  Administered 2013-11-03: 100 mL via INTRAVENOUS

## 2013-11-05 ENCOUNTER — Other Ambulatory Visit: Payer: Self-pay | Admitting: *Deleted

## 2013-11-05 MED ORDER — LISINOPRIL 40 MG PO TABS: 40.0000 mg | ORAL_TABLET | Freq: Every day | ORAL | Status: DC

## 2013-12-10 ENCOUNTER — Other Ambulatory Visit: Payer: Self-pay | Admitting: Cardiovascular Disease

## 2013-12-10 NOTE — Telephone Encounter (Signed)
Rx was sent to pharmacy electronically. 

## 2013-12-19 IMAGING — CR DG CHEST 2V
2 series · 2 of 2 positions shown · non-contrast
Comparison: None.

CLINICAL DATA: Chest pain

CHEST - 2 VIEW

[w chest pa]
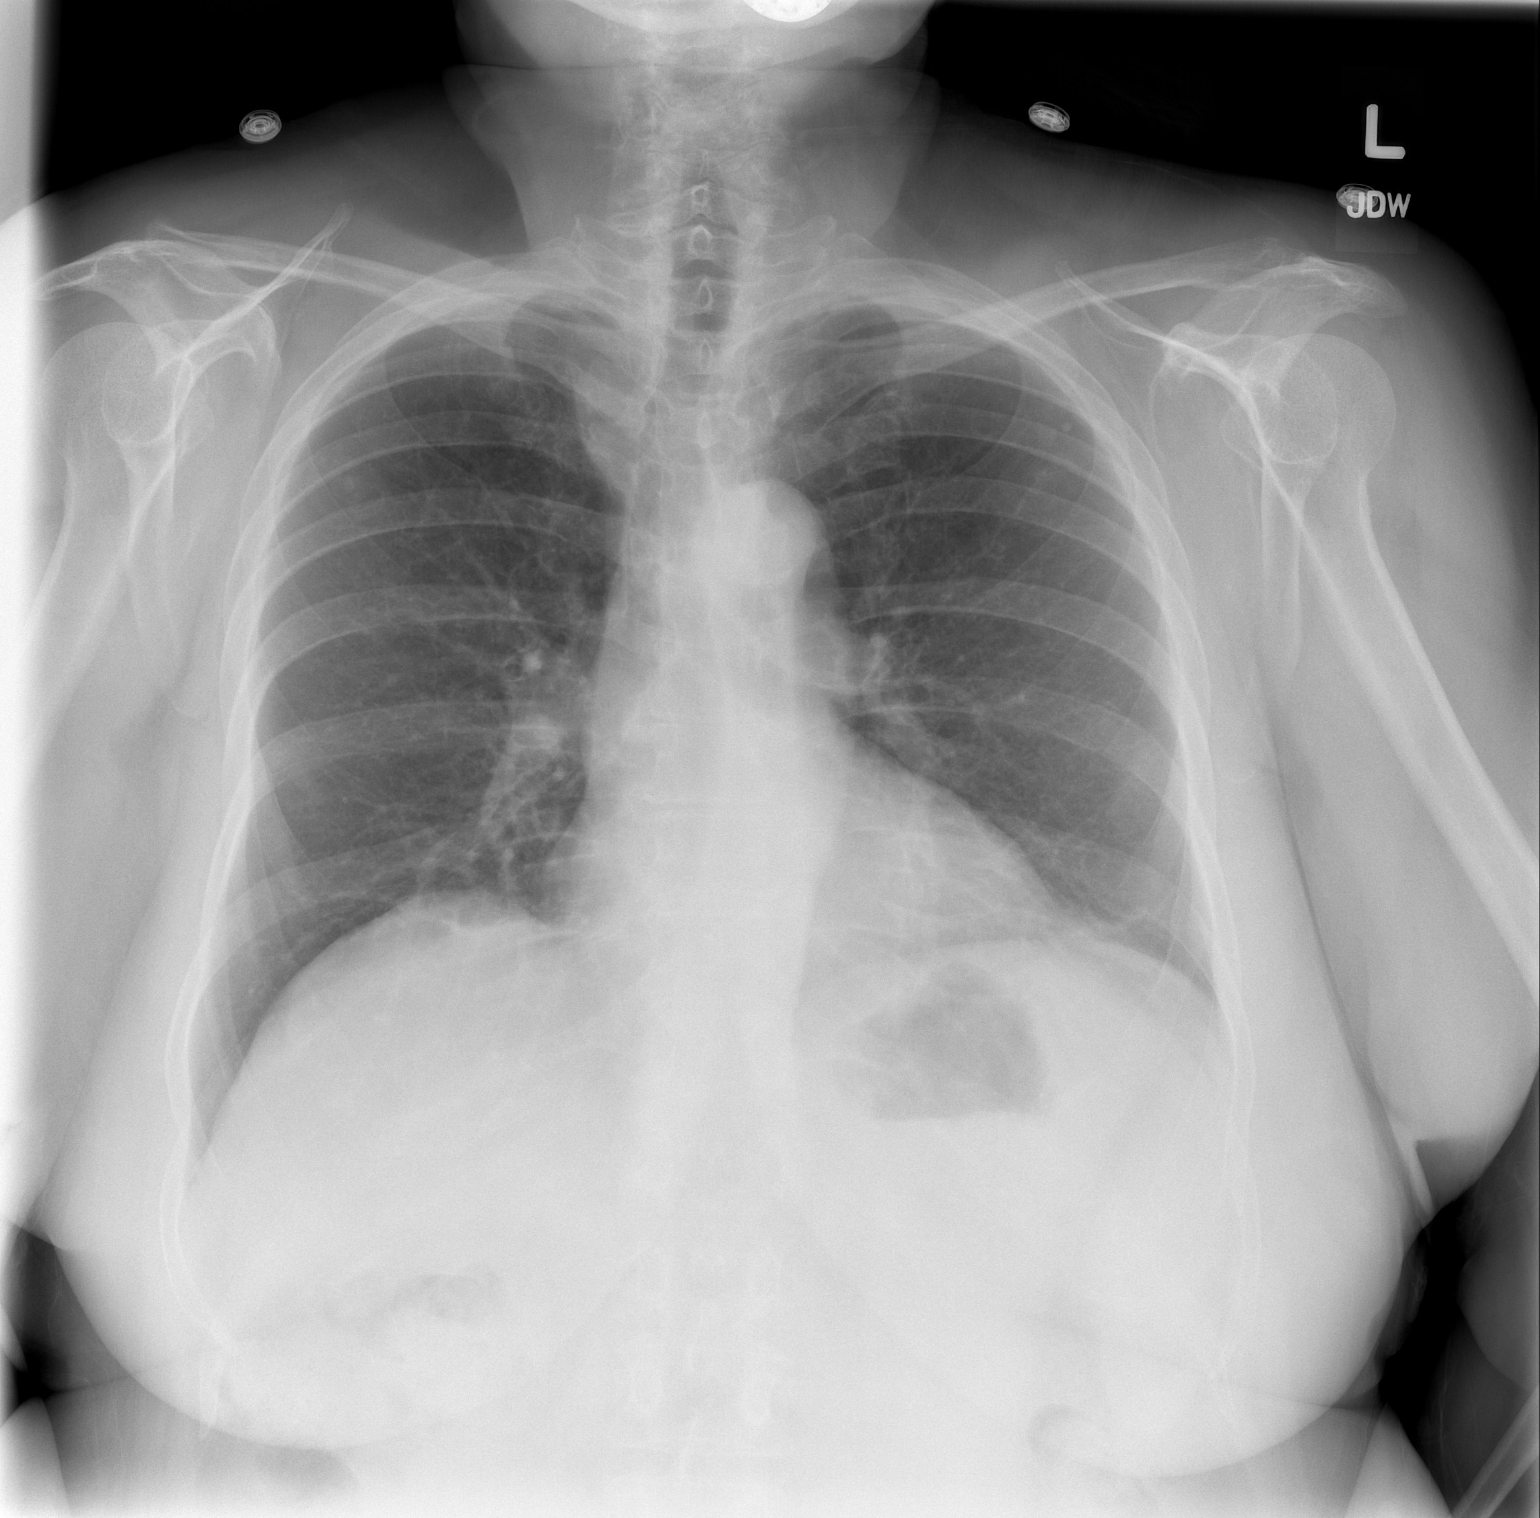

[w chest lat]
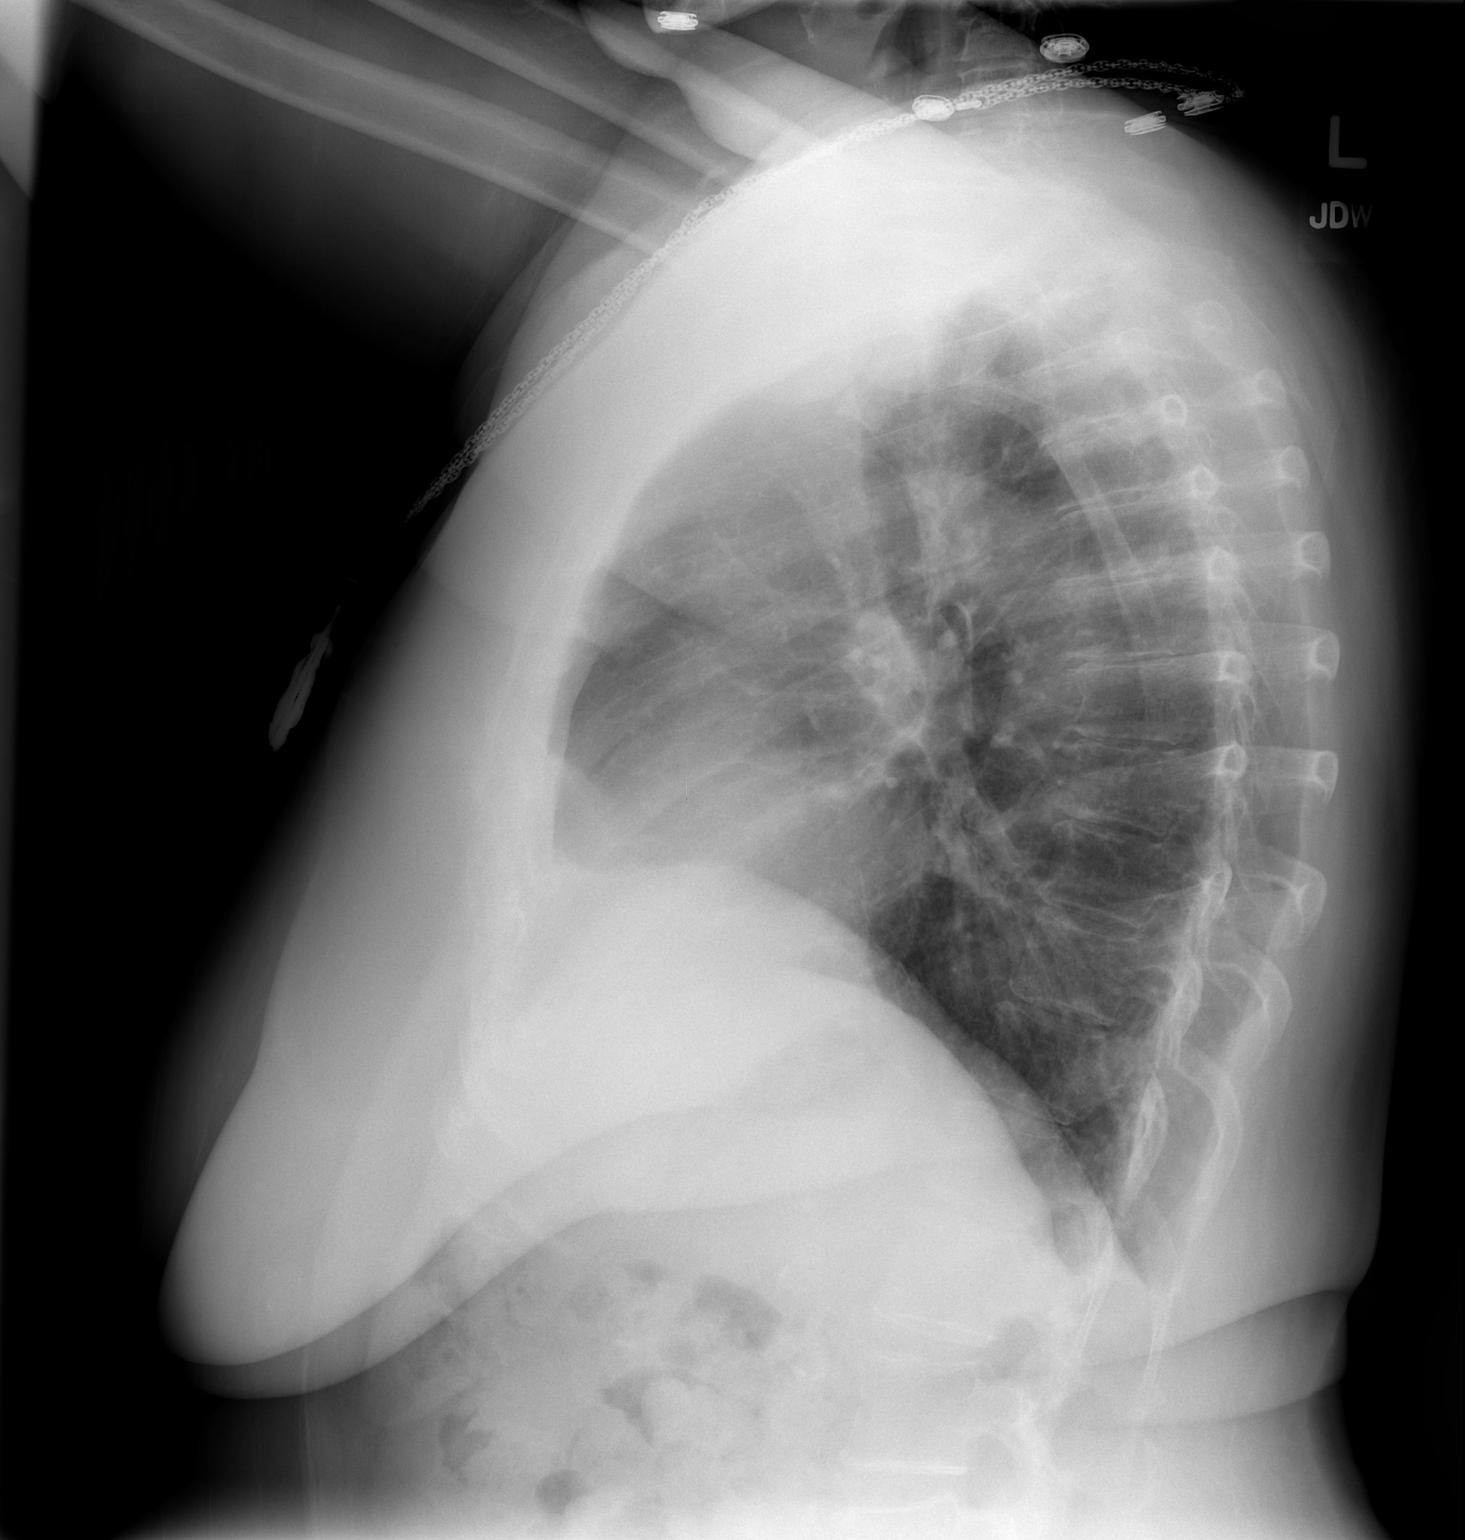

[2 of 2 positions shown; findings below may reference images not displayed]

FINDINGS: Probable small calcified granuloma in the left upper
lobe. Lungs clear.  Heart size and pulmonary vascularity normal.
No effusion.  Visualized bones unremarkable.
IMPRESSION: No acute disease

## 2014-01-04 NOTE — Addendum Note (Signed)
Addended by: ELKINS, JENNA M on: 01/04/2014 07:49 AM   Modules accepted: Orders  

## 2014-02-10 ENCOUNTER — Other Ambulatory Visit: Payer: Self-pay | Admitting: Family Medicine

## 2014-02-10 DIAGNOSIS — N6489 Other specified disorders of breast: Secondary | ICD-10-CM

## 2014-04-12 ENCOUNTER — Other Ambulatory Visit: Payer: Self-pay | Admitting: *Deleted

## 2014-04-12 MED ORDER — PANTOPRAZOLE SODIUM 40 MG PO TBEC: 40.0000 mg | DELAYED_RELEASE_TABLET | Freq: Every day | ORAL | Status: DC

## 2014-06-16 ENCOUNTER — Other Ambulatory Visit: Payer: Self-pay | Admitting: Internal Medicine

## 2014-06-16 NOTE — Telephone Encounter (Signed)
Rx was sent to pharmacy electronically. 

## 2014-09-05 ENCOUNTER — Other Ambulatory Visit: Payer: Self-pay

## 2014-09-05 MED ORDER — PANTOPRAZOLE SODIUM 40 MG PO TBEC: 40.0000 mg | DELAYED_RELEASE_TABLET | Freq: Every day | ORAL | Status: DC

## 2014-09-05 NOTE — Telephone Encounter (Signed)
Rx sent to pharmacy   

## 2014-10-18 ENCOUNTER — Ambulatory Visit
Admission: RE | Admit: 2014-10-18 | Discharge: 2014-10-18 | Disposition: A | Discharge status: Home / Self Care | Payer: Medicaid Other | Source: Ambulatory Visit | Attending: Family Medicine | Admitting: Family Medicine

## 2014-10-18 DIAGNOSIS — N6489 Other specified disorders of breast: Secondary | ICD-10-CM

## 2014-11-04 ENCOUNTER — Other Ambulatory Visit: Payer: Self-pay | Admitting: Internal Medicine

## 2014-11-17 ENCOUNTER — Other Ambulatory Visit: Payer: Self-pay | Admitting: Nurse Practitioner

## 2014-11-17 DIAGNOSIS — C220 Liver cell carcinoma: Secondary | ICD-10-CM

## 2014-11-28 ENCOUNTER — Ambulatory Visit
Admission: RE | Admit: 2014-11-28 | Discharge: 2014-11-28 | Disposition: A | Discharge status: Home / Self Care | Payer: Medicare Other | Source: Ambulatory Visit | Attending: Nurse Practitioner | Admitting: Nurse Practitioner

## 2014-11-28 DIAGNOSIS — C220 Liver cell carcinoma: Secondary | ICD-10-CM

## 2014-12-15 ENCOUNTER — Other Ambulatory Visit: Payer: Self-pay | Admitting: Internal Medicine

## 2014-12-15 NOTE — Telephone Encounter (Signed)
Rx(s) sent to pharmacy electronically.  

## 2015-01-10 ENCOUNTER — Other Ambulatory Visit: Payer: Self-pay

## 2015-01-10 MED ORDER — PANTOPRAZOLE SODIUM 40 MG PO TBEC: 40.0000 mg | DELAYED_RELEASE_TABLET | Freq: Every day | ORAL | Status: DC

## 2015-01-10 NOTE — Telephone Encounter (Signed)
Rx(s) sent to pharmacy electronically.  

## 2015-01-16 ENCOUNTER — Other Ambulatory Visit: Payer: Self-pay | Admitting: Internal Medicine

## 2015-01-18 ENCOUNTER — Other Ambulatory Visit: Payer: Self-pay | Admitting: Internal Medicine

## 2015-01-18 NOTE — Telephone Encounter (Signed)
Rx refill sent to patient pharmacy,

## 2015-04-04 IMAGING — US US ABDOMEN LIMITED
1 series · 13 of 25 positions shown · non-contrast
Comparison: CT ABD WO/W CM dated 02/19/2013; US ABDOMEN LIMITED
RUQ/ASCITES dated 10/30/2012

CLINICAL DATA: Hepatitis-C, evaluate for hepatoma

EXAM:
US ABDOMEN LIMITED - RIGHT UPPER QUADRANT

[Series 1: us abdomen limited · 0.28mm/px · 13 of 45 slices shown]
[im 1/45]
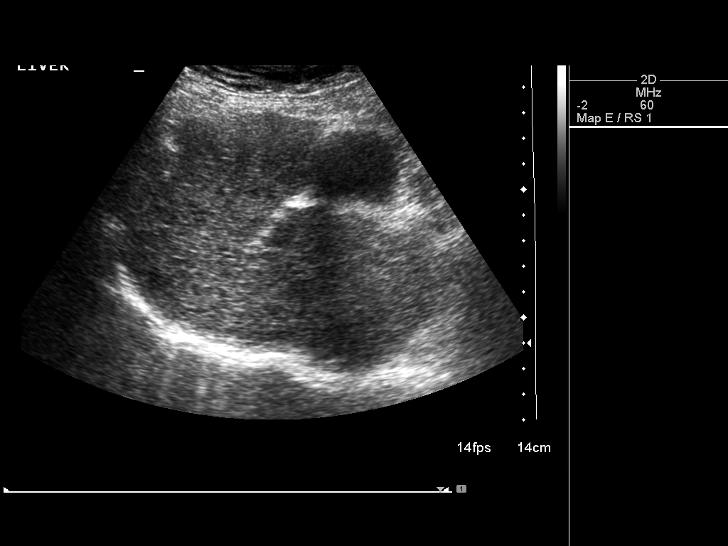
[im 4/45]
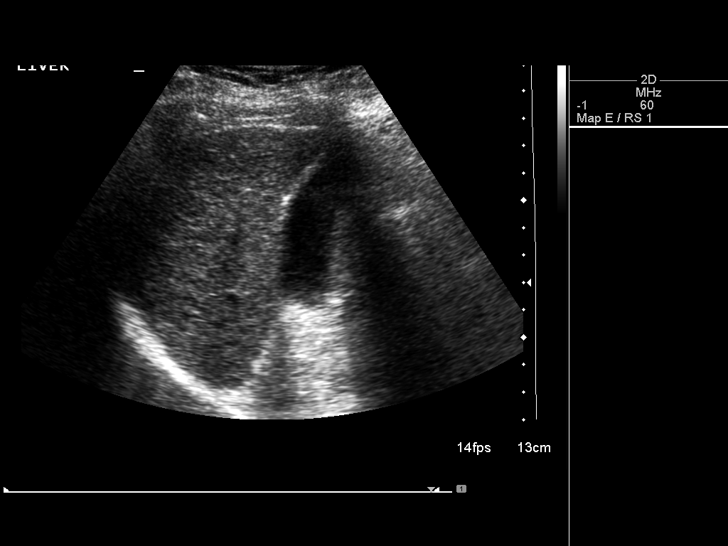
[im 8/45]
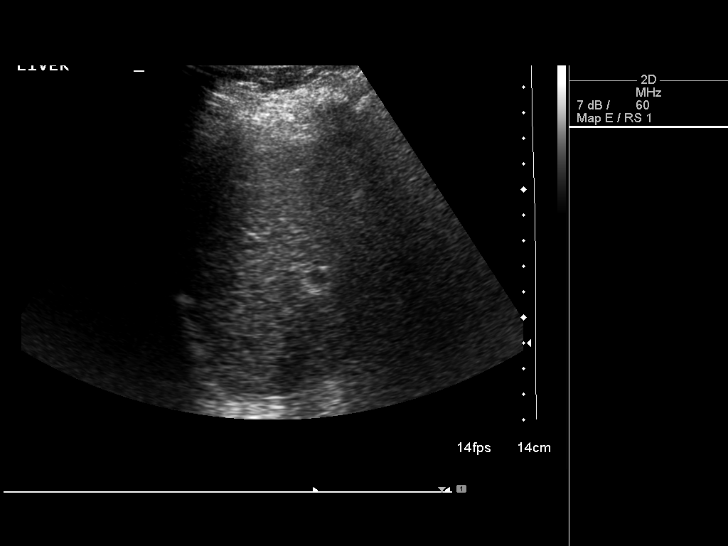
[im 12/45]
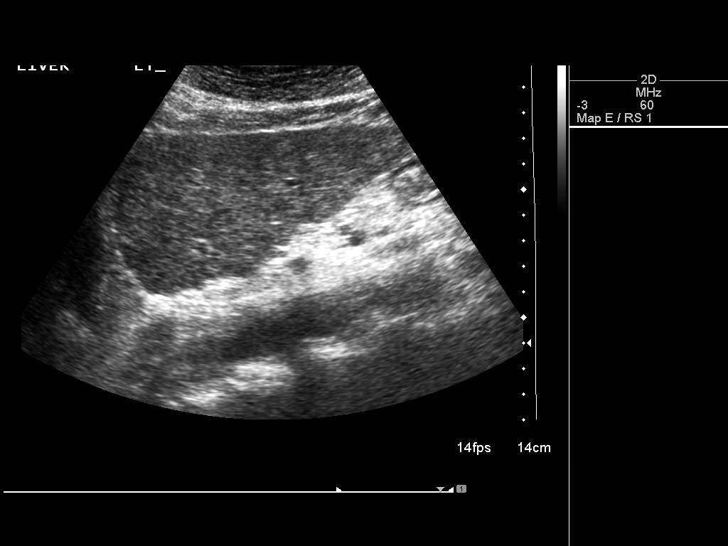
[im 15/45]
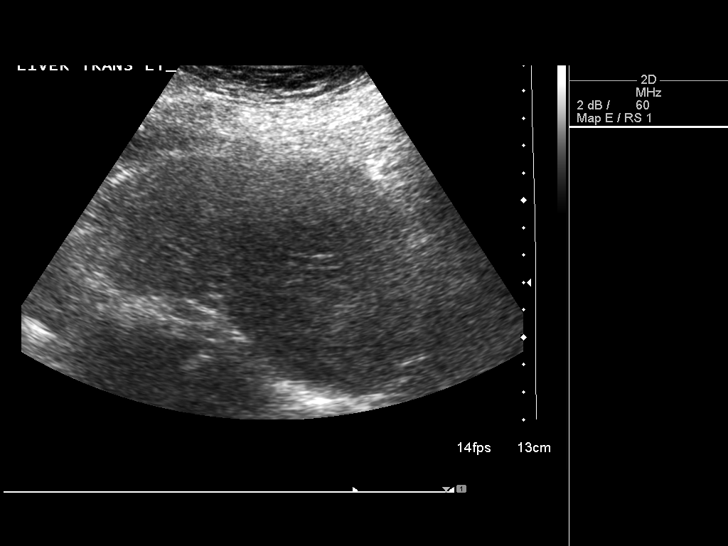
[im 19/45]
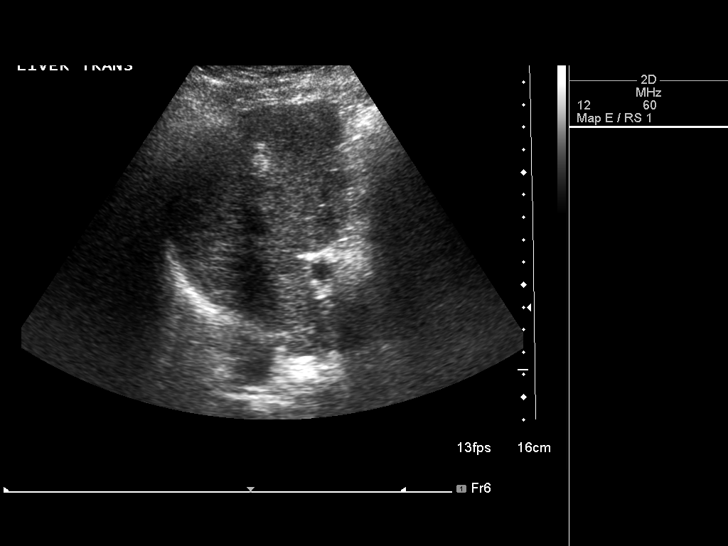
[im 23/45]
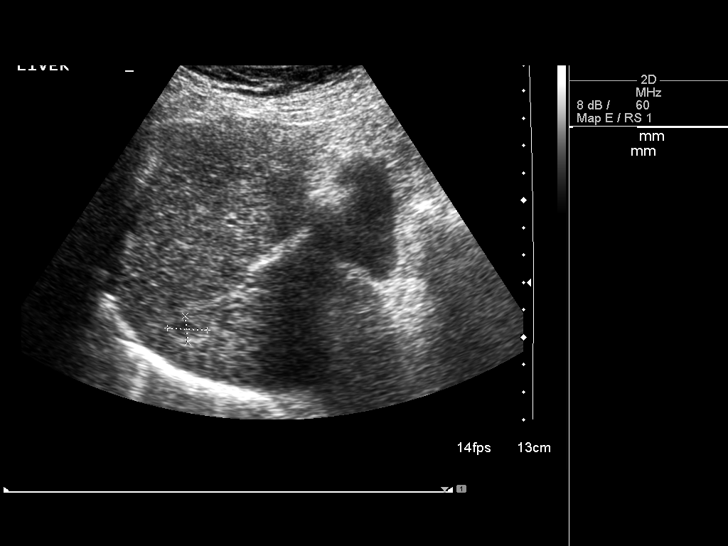
[im 26/45]
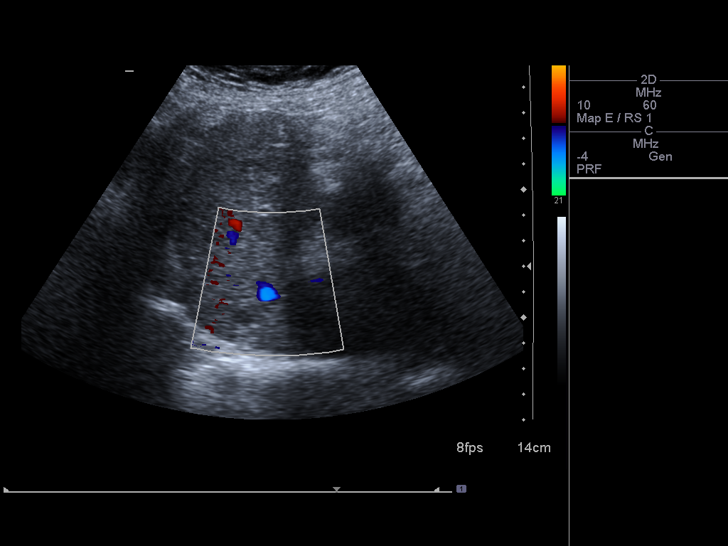
[im 30/45]
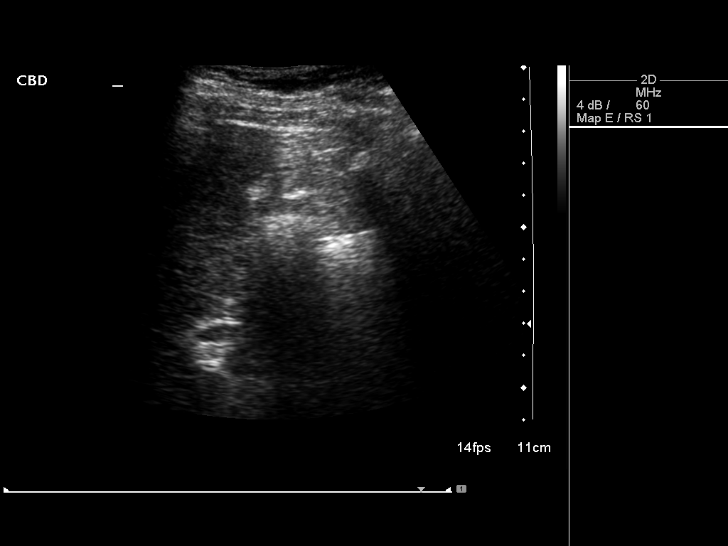
[im 34/45]
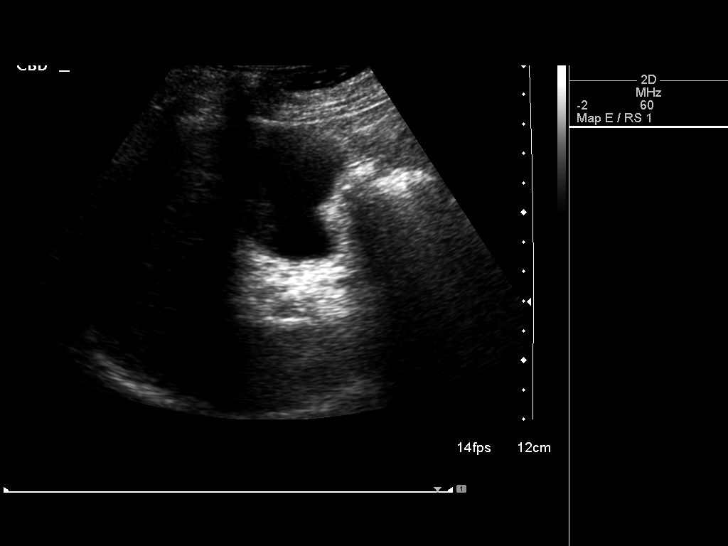
[im 37/45]
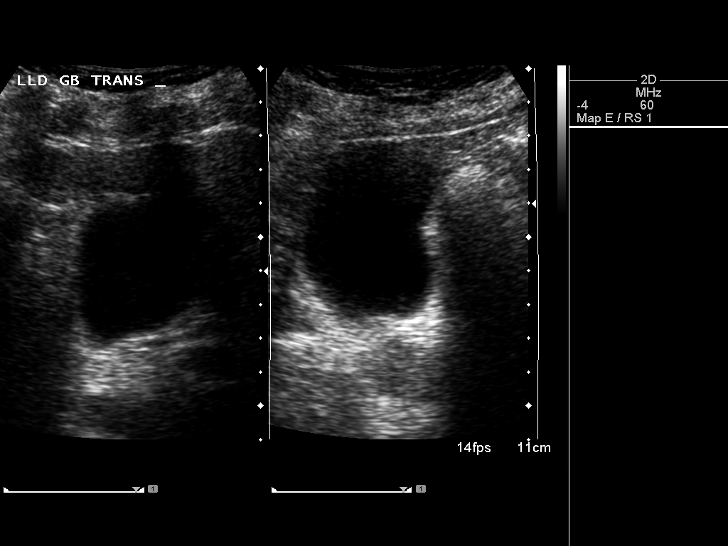
[im 41/45]
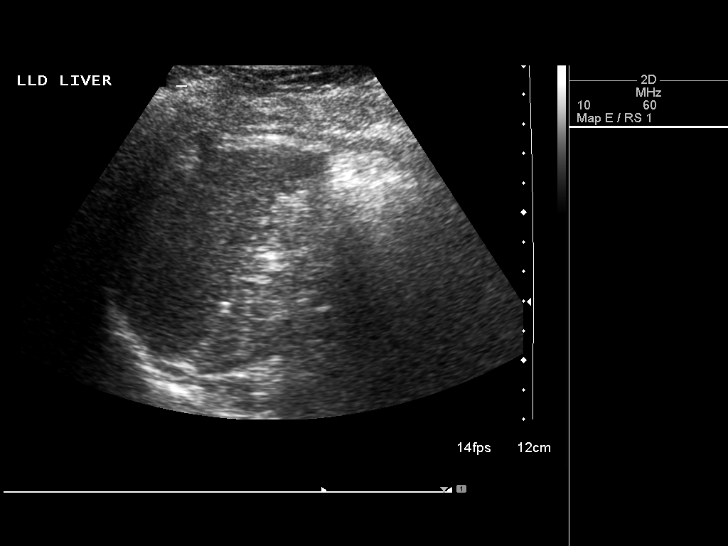
[im 45/45]
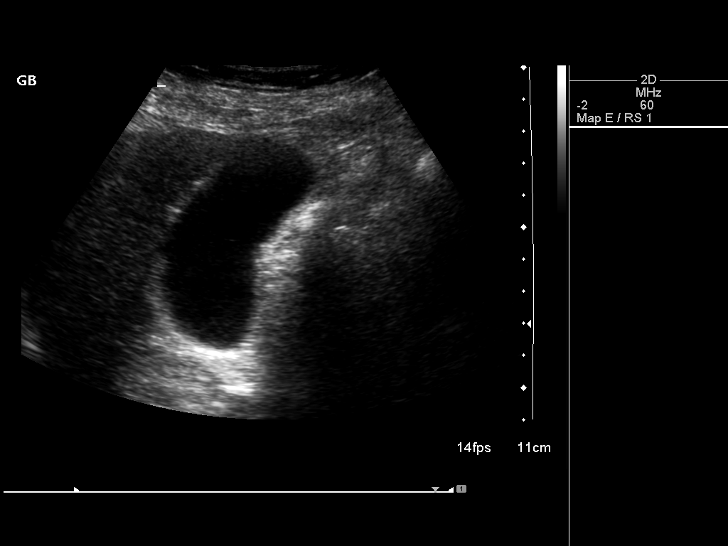

[13 of 25 positions shown; findings below may reference images not displayed]

FINDINGS: Gallbladder:

Sonographically normal. No echogenic gallstones or gall sludge. No
gallbladder wall thickening or pericholecystic fluid. Negative
sonographic Murphy's sign.

Common bile duct:

Diameter: Normal in size measuring 5.6 mm in diameter

Liver:

There is marked nodularity of the hepatic contour. The liver appears
small in size. There is marked diffuse heterogeneity of the hepatic
parenchymal echotexture. No ascites. Given extensive background
parenchymal abnormalities, evaluation for discrete hepatic
nodules/masses is extremely difficult. There is an apparently new
ill-defined approximately 1.5 x 1.0 x 1.2 cm hypoechoic peripherally
echogenic nodule within the posterior segment of the right lobe of
the liver (images 25 and 26). The previously questioned hypoechoic
nodules within the left lobe of the liver are not definitively seen
on the present examination though resolution should not be assumed
on the basis of this examination.
IMPRESSION: 1. Stigmata of cirrhosis including marked diffuse heterogeneity of
the hepatic parenchymal echotexture and nodularity of the hepatic
contour.
2. There is an apparently new ill-defined approximately 1.5 cm
hypoechoic nodule within the posterior segment of the right lobe of
the liver. Given changes of cirrhosis, further evaluation with AFP
and either cirrhosis protocol CT or MRI is recommended.

## 2015-06-20 ENCOUNTER — Encounter (HOSPITAL_COMMUNITY): Payer: Self-pay | Admitting: Psychiatry

## 2015-06-20 ENCOUNTER — Ambulatory Visit (INDEPENDENT_AMBULATORY_CARE_PROVIDER_SITE_OTHER): Payer: Medicare HMO | Admitting: Psychiatry

## 2015-06-20 VITALS — BP 126/76 | HR 66 | Ht 59.0 in | Wt 174.0 lb

## 2015-06-20 DIAGNOSIS — F1021 Alcohol dependence, in remission: Secondary | ICD-10-CM

## 2015-06-20 DIAGNOSIS — F331 Major depressive disorder, recurrent, moderate: Secondary | ICD-10-CM | POA: Diagnosis not present

## 2015-06-20 DIAGNOSIS — F411 Generalized anxiety disorder: Secondary | ICD-10-CM

## 2015-06-20 MED ORDER — BUSPIRONE HCL 7.5 MG PO TABS: 7.5000 mg | ORAL_TABLET | Freq: Two times a day (BID) | ORAL | Status: DC | PRN
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
## 2015-06-30 ENCOUNTER — Ambulatory Visit (INDEPENDENT_AMBULATORY_CARE_PROVIDER_SITE_OTHER): Payer: Medicare HMO | Admitting: Licensed Clinical Social Worker

## 2015-06-30 DIAGNOSIS — IMO0002 Reserved for concepts with insufficient information to code with codable children: Secondary | ICD-10-CM

## 2015-06-30 DIAGNOSIS — F1911 Other psychoactive substance abuse, in remission: Secondary | ICD-10-CM

## 2015-06-30 DIAGNOSIS — Z9149 Other personal history of psychological trauma, not elsewhere classified: Secondary | ICD-10-CM

## 2015-06-30 DIAGNOSIS — F1910 Other psychoactive substance abuse, uncomplicated: Secondary | ICD-10-CM

## 2015-06-30 DIAGNOSIS — F411 Generalized anxiety disorder: Secondary | ICD-10-CM
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
## 2015-07-14 ENCOUNTER — Ambulatory Visit (HOSPITAL_COMMUNITY): Payer: Self-pay | Admitting: Licensed Clinical Social Worker

## 2015-07-17 ENCOUNTER — Encounter (HOSPITAL_COMMUNITY): Payer: Self-pay | Admitting: Psychiatry

## 2015-07-17 ENCOUNTER — Ambulatory Visit (INDEPENDENT_AMBULATORY_CARE_PROVIDER_SITE_OTHER): Payer: Medicare HMO | Admitting: Psychiatry

## 2015-07-17 VITALS — BP 126/66 | HR 73 | Ht 59.0 in | Wt 176.0 lb

## 2015-07-17 DIAGNOSIS — F1911 Other psychoactive substance abuse, in remission: Secondary | ICD-10-CM

## 2015-07-17 DIAGNOSIS — F331 Major depressive disorder, recurrent, moderate: Secondary | ICD-10-CM

## 2015-07-17 DIAGNOSIS — F1910 Other psychoactive substance abuse, uncomplicated: Secondary | ICD-10-CM | POA: Diagnosis not present

## 2015-07-17 DIAGNOSIS — F411 Generalized anxiety disorder: Secondary | ICD-10-CM | POA: Diagnosis not present

## 2015-07-17 MED ORDER — BUSPIRONE HCL 7.5 MG PO TABS: 7.5000 mg | ORAL_TABLET | Freq: Two times a day (BID) | ORAL | Status: DC | PRN
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
## 2015-07-20 ENCOUNTER — Ambulatory Visit (INDEPENDENT_AMBULATORY_CARE_PROVIDER_SITE_OTHER): Payer: Medicare HMO | Admitting: Licensed Clinical Social Worker

## 2015-07-20 DIAGNOSIS — F411 Generalized anxiety disorder: Secondary | ICD-10-CM

## 2015-07-20 NOTE — Progress Notes (Signed)
   THERAPIST PROGRESS NOTE  Session Time: 9:00am-10:00am  Participation Level: Active  Behavioral Response: CasualAlertEuthymic  Type of Therapy: Individual therapy  Treatment Goals addressed: Anxiety  Interventions: Assertive communication, solution focused  Suicidal/Homicidal: Denied both  Therapist Interventions: Discussed difficulties patient has been having communicating her needs to her family.  Suggested writing as an alternative way to communicate which may be more effective.  Reassured her that her feelings are valid.  Encouraged her to work on Arboriculturist plans for the holidays.    Had patient fill out a GAD-7 to assess for severity of anxiety.     Summary:  Talked about feeling as though she is "being forgotten" by her family.  Has tried to communicate with them by phone or text but has been ignored.  Relies on family twice a month to take her to the grocery store.  She doesn't drive.  Wants to spend holidays with her family.  Receptive to the idea of writing to them.  Said she may send cards to the younger grandchildren.    GAD-7 score today was 5.  This is a significant improvement.  She said, "I've been putting in the Lord's hands."  Plan:  Will schedule one more appointment this month and one for next month.  Diagnosis: Generalized Anxiety Disorder    Stacy Richard 07/20/2015

## 2015-08-08 ENCOUNTER — Ambulatory Visit (HOSPITAL_COMMUNITY): Payer: Medicare HMO | Admitting: Licensed Clinical Social Worker

## 2015-08-14 ENCOUNTER — Telehealth (HOSPITAL_COMMUNITY): Payer: Self-pay | Admitting: *Deleted

## 2015-08-15 ENCOUNTER — Other Ambulatory Visit: Payer: Self-pay | Admitting: Nurse Practitioner

## 2015-08-15 DIAGNOSIS — C220 Liver cell carcinoma: Secondary | ICD-10-CM

## 2015-08-16 ENCOUNTER — Encounter (HOSPITAL_COMMUNITY): Payer: Self-pay | Admitting: Emergency Medicine

## 2015-08-16 ENCOUNTER — Inpatient Hospital Stay (HOSPITAL_COMMUNITY)
Admission: EM | Admit: 2015-08-16 | Discharge: 2015-08-21 | DRG: 378 | Disposition: A | Discharge status: Home / Self Care | Payer: Medicare HMO | Attending: Internal Medicine | Admitting: Internal Medicine

## 2015-08-16 DIAGNOSIS — K766 Portal hypertension: Secondary | ICD-10-CM | POA: Diagnosis present

## 2015-08-16 DIAGNOSIS — Z8051 Family history of malignant neoplasm of kidney: Secondary | ICD-10-CM | POA: Diagnosis not present

## 2015-08-16 DIAGNOSIS — K922 Gastrointestinal hemorrhage, unspecified: Secondary | ICD-10-CM | POA: Diagnosis not present

## 2015-08-16 DIAGNOSIS — E876 Hypokalemia: Secondary | ICD-10-CM | POA: Diagnosis present

## 2015-08-16 DIAGNOSIS — D696 Thrombocytopenia, unspecified: Secondary | ICD-10-CM | POA: Diagnosis present

## 2015-08-16 DIAGNOSIS — K449 Diaphragmatic hernia without obstruction or gangrene: Secondary | ICD-10-CM | POA: Diagnosis present

## 2015-08-16 DIAGNOSIS — R001 Bradycardia, unspecified: Secondary | ICD-10-CM | POA: Diagnosis present

## 2015-08-16 DIAGNOSIS — I8500 Esophageal varices without bleeding: Secondary | ICD-10-CM | POA: Diagnosis present

## 2015-08-16 DIAGNOSIS — Z7982 Long term (current) use of aspirin: Secondary | ICD-10-CM

## 2015-08-16 DIAGNOSIS — Z833 Family history of diabetes mellitus: Secondary | ICD-10-CM

## 2015-08-16 DIAGNOSIS — K219 Gastro-esophageal reflux disease without esophagitis: Secondary | ICD-10-CM | POA: Diagnosis present

## 2015-08-16 DIAGNOSIS — Z6835 Body mass index (BMI) 35.0-35.9, adult: Secondary | ICD-10-CM

## 2015-08-16 DIAGNOSIS — K3189 Other diseases of stomach and duodenum: Secondary | ICD-10-CM | POA: Diagnosis present

## 2015-08-16 DIAGNOSIS — K920 Hematemesis: Secondary | ICD-10-CM | POA: Diagnosis not present

## 2015-08-16 DIAGNOSIS — E669 Obesity, unspecified: Secondary | ICD-10-CM | POA: Diagnosis present

## 2015-08-16 DIAGNOSIS — B182 Chronic viral hepatitis C: Secondary | ICD-10-CM | POA: Diagnosis not present

## 2015-08-16 DIAGNOSIS — K7469 Other cirrhosis of liver: Secondary | ICD-10-CM | POA: Diagnosis not present

## 2015-08-16 DIAGNOSIS — K921 Melena: Secondary | ICD-10-CM | POA: Diagnosis present

## 2015-08-16 DIAGNOSIS — I10 Essential (primary) hypertension: Secondary | ICD-10-CM | POA: Diagnosis present

## 2015-08-16 DIAGNOSIS — Z87891 Personal history of nicotine dependence: Secondary | ICD-10-CM | POA: Diagnosis not present

## 2015-08-16 DIAGNOSIS — I8510 Secondary esophageal varices without bleeding: Secondary | ICD-10-CM | POA: Diagnosis present

## 2015-08-16 DIAGNOSIS — E119 Type 2 diabetes mellitus without complications: Secondary | ICD-10-CM | POA: Diagnosis present

## 2015-08-16 DIAGNOSIS — Z8249 Family history of ischemic heart disease and other diseases of the circulatory system: Secondary | ICD-10-CM | POA: Diagnosis not present

## 2015-08-16 DIAGNOSIS — D62 Acute posthemorrhagic anemia: Secondary | ICD-10-CM | POA: Diagnosis not present

## 2015-08-16 DIAGNOSIS — Z79899 Other long term (current) drug therapy: Secondary | ICD-10-CM

## 2015-08-16 DIAGNOSIS — I2510 Atherosclerotic heart disease of native coronary artery without angina pectoris: Secondary | ICD-10-CM | POA: Diagnosis present

## 2015-08-16 DIAGNOSIS — E8809 Other disorders of plasma-protein metabolism, not elsewhere classified: Secondary | ICD-10-CM | POA: Diagnosis present

## 2015-08-16 DIAGNOSIS — B1920 Unspecified viral hepatitis C without hepatic coma: Secondary | ICD-10-CM | POA: Diagnosis present

## 2015-08-16 DIAGNOSIS — F1290 Cannabis use, unspecified, uncomplicated: Secondary | ICD-10-CM | POA: Diagnosis present

## 2015-08-16 DIAGNOSIS — K7460 Unspecified cirrhosis of liver: Secondary | ICD-10-CM | POA: Diagnosis present

## 2015-08-16 LAB — PROTIME-INR
INR: 1.32 (ref 0.00–1.49)
Prothrombin Time: 16.5 seconds — ABNORMAL HIGH (ref 11.6–15.2)

## 2015-08-16 LAB — GLUCOSE, CAPILLARY: GLUCOSE-CAPILLARY: 227 mg/dL — AB (ref 65–99)

## 2015-08-16 LAB — COMPREHENSIVE METABOLIC PANEL
ALT: 80 U/L — AB (ref 14–54)
ANION GAP: 13 (ref 5–15)
AST: 118 U/L — ABNORMAL HIGH (ref 15–41)
Albumin: 3.1 g/dL — ABNORMAL LOW (ref 3.5–5.0)
Alkaline Phosphatase: 210 U/L — ABNORMAL HIGH (ref 38–126)
BUN: 26 mg/dL — ABNORMAL HIGH (ref 6–20)
CALCIUM: 8.7 mg/dL — AB (ref 8.9–10.3)
CHLORIDE: 100 mmol/L — AB (ref 101–111)
CO2: 23 mmol/L (ref 22–32)
CREATININE: 1.02 mg/dL — AB (ref 0.44–1.00)
GFR calc non Af Amer: 55 mL/min — ABNORMAL LOW (ref >60)
Glucose, Bld: 218 mg/dL — ABNORMAL HIGH (ref 65–99)
Potassium: 4.1 mmol/L (ref 3.5–5.1)
Sodium: 136 mmol/L (ref 135–145)
TOTAL PROTEIN: 8.2 g/dL — AB (ref 6.5–8.1)
Total Bilirubin: 1.2 mg/dL (ref 0.3–1.2)

## 2015-08-16 LAB — CBC WITH DIFFERENTIAL/PLATELET
Basophils Absolute: 0 10*3/uL (ref 0.0–0.1)
Basophils Relative: 0 %
EOS ABS: 0.1 10*3/uL (ref 0.0–0.7)
Eosinophils Relative: 0 %
HCT: 36.3 % (ref 36.0–46.0)
Hemoglobin: 12 g/dL (ref 12.0–15.0)
LYMPHS ABS: 3.6 10*3/uL (ref 0.7–4.0)
Lymphocytes Relative: 30 %
MCH: 31.7 pg (ref 26.0–34.0)
MCHC: 33.1 g/dL (ref 30.0–36.0)
MCV: 95.8 fL (ref 78.0–100.0)
Monocytes Absolute: 0.8 10*3/uL (ref 0.1–1.0)
Monocytes Relative: 7 %
Neutro Abs: 7.4 10*3/uL (ref 1.7–7.7)
Neutrophils Relative %: 63 %
Platelets: 154 10*3/uL (ref 150–400)
RBC: 3.79 MIL/uL — ABNORMAL LOW (ref 3.87–5.11)
RDW: 13.9 % (ref 11.5–15.5)
WBC: 11.8 10*3/uL — ABNORMAL HIGH (ref 4.0–10.5)

## 2015-08-16 LAB — MRSA PCR SCREENING: MRSA BY PCR: NEGATIVE

## 2015-08-16 LAB — I-STAT CHEM 8, ED
BUN: 32 mg/dL — ABNORMAL HIGH (ref 6–20)
CHLORIDE: 100 mmol/L — AB (ref 101–111)
CREATININE: 0.8 mg/dL (ref 0.44–1.00)
Calcium, Ion: 1.04 mmol/L — ABNORMAL LOW (ref 1.13–1.30)
Glucose, Bld: 216 mg/dL — ABNORMAL HIGH (ref 65–99)
HCT: 41 % (ref 36.0–46.0)
Hemoglobin: 13.9 g/dL (ref 12.0–15.0)
POTASSIUM: 4.1 mmol/L (ref 3.5–5.1)
Sodium: 140 mmol/L (ref 135–145)
TCO2: 25 mmol/L (ref 0–100)

## 2015-08-16 LAB — OCCULT BLOOD, POC DEVICE: FECAL OCCULT BLD: NEGATIVE

## 2015-08-16 LAB — TYPE AND SCREEN
ABO/RH(D): O POS
Antibody Screen: NEGATIVE

## 2015-08-16 LAB — APTT: aPTT: 29 seconds (ref 24–37)

## 2015-08-16 LAB — ABO/RH: ABO/RH(D): O POS

## 2015-08-16 MED ORDER — SODIUM CHLORIDE 0.9 % IV SOLN
8.0000 mg/h | INTRAVENOUS | Status: DC
  Administered 2015-08-16 – 2015-08-17 (×2): 8 mg/h via INTRAVENOUS
  Filled 2015-08-16 (×3): qty 80

## 2015-08-16 MED ORDER — DULOXETINE HCL 60 MG PO CPEP
60.0000 mg | ORAL_CAPSULE | Freq: Every day | ORAL | Status: DC
  Administered 2015-08-18 – 2015-08-21 (×4): 60 mg via ORAL
  Filled 2015-08-16 (×2): qty 1
  Filled 2015-08-16: qty 2
  Filled 2015-08-16: qty 1

## 2015-08-16 MED ORDER — INSULIN ASPART 100 UNIT/ML ~~LOC~~ SOLN
0.0000 [IU] | SUBCUTANEOUS | Status: DC
  Administered 2015-08-17: 3 [IU] via SUBCUTANEOUS

## 2015-08-16 MED ORDER — DEXTROSE 5 % IV SOLN
2.0000 g | INTRAVENOUS | Status: DC
  Administered 2015-08-17: 2 g via INTRAVENOUS
  Filled 2015-08-16 (×2): qty 2

## 2015-08-16 MED ORDER — OXYBUTYNIN CHLORIDE ER 5 MG PO TB24
5.0000 mg | ORAL_TABLET | Freq: Every day | ORAL | Status: DC
  Administered 2015-08-18 – 2015-08-21 (×4): 5 mg via ORAL
  Filled 2015-08-16 (×4): qty 1

## 2015-08-16 MED ORDER — LACTULOSE 10 GM/15ML PO SOLN
10.0000 g | Freq: Two times a day (BID) | ORAL | Status: DC
  Administered 2015-08-16 – 2015-08-21 (×10): 10 g via ORAL
  Filled 2015-08-16 (×10): qty 15

## 2015-08-16 MED ORDER — SODIUM CHLORIDE 0.9 % IV SOLN
INTRAVENOUS | Status: DC
  Administered 2015-08-16 – 2015-08-18 (×2): via INTRAVENOUS

## 2015-08-16 MED ORDER — ACETAMINOPHEN 650 MG RE SUPP: 650.0000 mg | Freq: Four times a day (QID) | RECTAL | Status: DC | PRN

## 2015-08-16 MED ORDER — SODIUM CHLORIDE 0.9 % IV SOLN
50.0000 ug/h | INTRAVENOUS | Status: DC
  Administered 2015-08-16 – 2015-08-17 (×2): 50 ug/h via INTRAVENOUS
  Filled 2015-08-16 (×4): qty 1

## 2015-08-16 MED ORDER — ONDANSETRON HCL 4 MG/2ML IJ SOLN: 4.0000 mg | Freq: Four times a day (QID) | INTRAMUSCULAR | Status: DC | PRN

## 2015-08-16 MED ORDER — PANTOPRAZOLE SODIUM 40 MG IV SOLR
40.0000 mg | Freq: Once | INTRAVENOUS | Status: AC
  Administered 2015-08-16: 40 mg via INTRAVENOUS
  Filled 2015-08-16: qty 40

## 2015-08-16 MED ORDER — OCTREOTIDE LOAD VIA INFUSION
50.0000 ug | Freq: Once | INTRAVENOUS | Status: AC
  Administered 2015-08-16: 50 ug via INTRAVENOUS
  Filled 2015-08-16: qty 25

## 2015-08-16 MED ORDER — BUSPIRONE HCL 15 MG PO TABS
7.5000 mg | ORAL_TABLET | Freq: Every day | ORAL | Status: DC
  Administered 2015-08-18 – 2015-08-21 (×4): 7.5 mg via ORAL
  Filled 2015-08-16 (×5): qty 1

## 2015-08-16 MED ORDER — ARTIFICIAL TEARS OP OINT
1.0000 "application " | TOPICAL_OINTMENT | OPHTHALMIC | Status: DC | PRN
  Filled 2015-08-16: qty 3.5

## 2015-08-16 MED ORDER — DEXTROSE 5 % IV SOLN
1.0000 g | Freq: Once | INTRAVENOUS | Status: AC
  Administered 2015-08-16: 1 g via INTRAVENOUS
  Filled 2015-08-16: qty 10

## 2015-08-16 MED ORDER — ONDANSETRON HCL 4 MG PO TABS
4.0000 mg | ORAL_TABLET | Freq: Four times a day (QID) | ORAL | Status: DC | PRN
Start: 2015-08-16 — End: 2015-08-22

## 2015-08-16 MED ORDER — PANTOPRAZOLE SODIUM 40 MG IV SOLR: 40.0000 mg | Freq: Two times a day (BID) | INTRAVENOUS | Status: DC

## 2015-08-16 MED ORDER — ALUM & MAG HYDROXIDE-SIMETH 200-200-20 MG/5ML PO SUSP: 30.0000 mL | Freq: Four times a day (QID) | ORAL | Status: DC | PRN

## 2015-08-16 MED ORDER — OXYCODONE HCL 5 MG PO TABS: 5.0000 mg | ORAL_TABLET | ORAL | Status: DC | PRN

## 2015-08-16 MED ORDER — ACETAMINOPHEN 325 MG PO TABS: 650.0000 mg | ORAL_TABLET | Freq: Three times a day (TID) | ORAL | Status: DC | PRN

## 2015-08-16 MED ORDER — SODIUM CHLORIDE 0.9 % IV BOLUS (SEPSIS)
1000.0000 mL | Freq: Once | INTRAVENOUS | Status: AC
  Administered 2015-08-16: 1000 mL via INTRAVENOUS

## 2015-08-16 MED ORDER — HYDROMORPHONE HCL 1 MG/ML IJ SOLN: 0.5000 mg | INTRAMUSCULAR | Status: DC | PRN

## 2015-08-16 NOTE — ED Provider Notes (Signed)
CSN: [id number]     Arrival date & time 08/16/15  1522 History   First MD Initiated Contact with Patient 08/16/15 1539     Chief Complaint  Patient presents with  . Hematemesis     (Consider location/radiation/quality/duration/timing/severity/associated sxs/prior Treatment) Patient is a 69 y.o. female presenting with general illness. The history is provided by the patient.  Illness Severity:  Moderate Onset quality:  Sudden Duration:  1 hour Timing:  Intermittent Progression:  Resolved Chronicity:  New Associated symptoms: nausea and vomiting (grossly bloody x4 in PCP office)   Associated symptoms: no chest pain, no congestion, no fever, no headaches, no myalgias, no rhinorrhea, no shortness of breath and no wheezing     69 yo F with a chief complaint of hematemesis. Patient vomited 4 times bright red blood in her PCPs office. Patient was there for follow-up for left ankle pain. Patient denies significant NSAID use denies significant Tylenol use. Patient has a history of hepatitis C does not know she's never had varices. Denies abdominal pain. Denies near-syncope or syncope. Denies lightheadedness. On scene EMS reported a blood pressure of 80/44. Improved with 500 mL IV fluid.  Past Medical History  Diagnosis Date  . GERD (gastroesophageal reflux disease)   . Allergy     Rhinitis  . Hepatitis C   . Osteopenia   . NIDDM (non-insulin dependent diabetes mellitus)   . Osteoarthritis of knee     right knee  . Hypertension   . Arthritis     left hip   Past Surgical History  Procedure Laterality Date  . Knee surgery    . Transthoracic echocardiogram  09/2012    EF 55-54%, mild LVH & mild conc hypertrophy, grade 1 diastolic dysfunction; mild MR  . Nm myocar perf wall motion  09/2012    lexiscan myoview; normal study, low risk   Family History  Problem Relation Age of Onset  . Heart failure Mother   . Hepatitis Mother   . Kidney cancer Mother   . Heart disease Father   .  Heart Problems Sister     murmur  . Diabetes type II Sister   . Heart Problems Daughter     murmur  . Bipolar disorder Daughter    Social History  Substance Use Topics  . Smoking status: Former Smoker    Quit date: 10/28/2009  . Smokeless tobacco: Never Used  . Alcohol Use: No   OB History    No data available     Review of Systems  Constitutional: Negative for fever and chills.  HENT: Negative for congestion and rhinorrhea.   Eyes: Negative for redness and visual disturbance.  Respiratory: Negative for shortness of breath and wheezing.   Cardiovascular: Negative for chest pain and palpitations.  Gastrointestinal: Positive for nausea and vomiting (grossly bloody x4 in PCP office).  Genitourinary: Negative for dysuria and urgency.  Musculoskeletal: Negative for myalgias and arthralgias.  Skin: Negative for pallor and wound.  Neurological: Negative for dizziness and headaches.      Allergies  Review of patient's allergies indicates no known allergies.  Home Medications   Prior to Admission medications   Medication Sig Start Date End Date Taking? Authorizing Provider  Artificial Tear Ointment (ARTIFICIAL TEARS) ointment Place 1 application into both eyes as needed.    Yes Historical Provider, MD  aspirin 81 MG tablet Take 81 mg by mouth daily.     Yes Historical Provider, MD  busPIRone (BUSPAR) 7.5 MG tablet Take 1 tablet (  7.5 mg total) by mouth 2 (two) times daily as needed. Patient taking differently: Take 7.5 mg by mouth daily.  07/17/15  Yes Merian Capron, MD  Calcium Carb-Cholecalciferol (CALTRATE 600+D) 600-800 MG-UNIT TABS Take 1 tablet by mouth 2 (two) times daily.   Yes Historical Provider, MD  calcium carbonate (TUMS - DOSED IN MG ELEMENTAL CALCIUM) 500 MG chewable tablet Chew 2 tablets by mouth 3 (three) times daily as needed for indigestion or heartburn.   Yes Historical Provider, MD  DULoxetine (CYMBALTA) 60 MG capsule Take 60 mg by mouth daily.     Yes  Historical Provider, MD  erythromycin ophthalmic ointment Place 1 application into both eyes at bedtime.   Yes Historical Provider, MD  lactulose (CHRONULAC) 10 GM/15ML solution Take 10 g by mouth 2 (two) times daily.   Yes Historical Provider, MD  loratadine (CLARITIN) 10 MG tablet Take 10 mg by mouth daily.   Yes Historical Provider, MD  Multiple Vitamin (MULTIVITAMIN WITH MINERALS) TABS tablet Take 1 tablet by mouth daily. Multivitamin with lutein and lycopene   Yes Historical Provider, MD  Omega-3 Fatty Acids (FISH OIL) 1000 MG CAPS Take 1 capsule by mouth daily.   Yes Historical Provider, MD  oxybutynin (DITROPAN-XL) 5 MG 24 hr tablet Take 5 mg by mouth daily.   Yes Historical Provider, MD  Polyethyl Glycol-Propyl Glycol (SYSTANE OP) Apply 1 drop to eye daily as needed (dry eye).    Yes Historical Provider, MD  triamterene-hydrochlorothiazide (DYAZIDE) 37.5-25 MG per capsule Take 1 each (1 capsule total) by mouth daily. *MUST MAKE APPOINTMENT* 01/18/15  Yes Pixie Casino, MD  lisinopril (PRINIVIL,ZESTRIL) 40 MG tablet TAKE 1 TABLET BY MOUTH DAILY 11/04/14   Pixie Casino, MD  pantoprazole (PROTONIX) 40 MG tablet Take 1 tablet (40 mg total) by mouth daily. NEEDS APPOINTMENT FOR FUTURE REFILLS 01/10/15   Troy Sine, MD   BP 105/46 mmHg  Pulse 88  Temp(Src) 98.5 F (36.9 C) (Oral)  Resp 20  Ht 4\' 11"  (1.499 m)  Wt 176 lb 2.4 oz (79.9 kg)  BMI 35.56 kg/m2  SpO2 96% Physical Exam  Constitutional: She is oriented to person, place, and time. She appears well-developed and well-nourished. No distress.  HENT:  Head: Normocephalic and atraumatic.  Eyes: EOM are normal. Pupils are equal, round, and reactive to light.  Neck: Normal range of motion. Neck supple.  Cardiovascular: Normal rate and regular rhythm.  Exam reveals no gallop and no friction rub.   No murmur heard. Pulmonary/Chest: Effort normal. She has no wheezes. She has no rales.  Abdominal: Soft. She exhibits no distension.  There is no tenderness. There is no rebound and no guarding.  Musculoskeletal: She exhibits no edema or tenderness.  Neurological: She is alert and oriented to person, place, and time.  Skin: Skin is warm and dry. She is not diaphoretic.  Psychiatric: She has a normal mood and affect. Her behavior is normal.  Nursing note and vitals reviewed.   ED Course  Procedures (including critical care time) Labs Review Labs Reviewed  CBC WITH DIFFERENTIAL/PLATELET - Abnormal; Notable for the following:    WBC 11.8 (*)    RBC 3.79 (*)    All other components within normal limits  COMPREHENSIVE METABOLIC PANEL - Abnormal; Notable for the following:    Chloride 100 (*)    Glucose, Bld 218 (*)    BUN 26 (*)    Creatinine, Ser 1.02 (*)    Calcium 8.7 (*)  Total Protein 8.2 (*)    Albumin 3.1 (*)    AST 118 (*)    ALT 80 (*)    Alkaline Phosphatase 210 (*)    GFR calc non Af Amer 55 (*)    All other components within normal limits  PROTIME-INR - Abnormal; Notable for the following:    Prothrombin Time 16.5 (*)    All other components within normal limits  I-STAT CHEM 8, ED - Abnormal; Notable for the following:    Chloride 100 (*)    BUN 32 (*)    Glucose, Bld 216 (*)    Calcium, Ion 1.04 (*)    All other components within normal limits  MRSA PCR SCREENING  APTT  HEMOGLOBIN [id number]  BASIC METABOLIC PANEL  CBC  POC OCCULT BLOOD, ED  OCCULT BLOOD, POC DEVICE  TYPE AND SCREEN  ABO/RH    Imaging Review No results found. I have personally reviewed and evaluated these images and lab results as part of my medical decision-making.   EKG Interpretation None      MDM   Final diagnoses:  Upper GI bleed    69 yo F with a chief complaint of hematemesis. Sounds like she had a significant amount in front of medical personnel. Will contact GI, Admission.   Concern for variceal bleed patient was started on octreotide and Protonix given Rocephin.  CRITICAL CARE Performed by: Cecilio Asper   Total critical care time: 35 minutes  Critical care time was exclusive of separately billable procedures and treating other patients.  Critical care was necessary to treat or prevent imminent or life-threatening deterioration.  Critical care was time spent personally by me on the following activities: development of treatment plan with patient and/or surrogate as well as nursing, discussions with consultants, evaluation of patient's response to treatment, examination of patient, obtaining history from patient or surrogate, ordering and performing treatments and interventions, ordering and review of laboratory studies, ordering and review of radiographic studies, pulse oximetry and re-evaluation of patient's condition.   The patients results and plan were reviewed and discussed.   Any x-rays performed were independently reviewed by myself.   Differential diagnosis were considered with the presenting HPI.  Medications  octreotide (SANDOSTATIN) 2 mcg/mL load via infusion 50 mcg ( Intravenous Canceled Entry 08/16/15 2108)    And  octreotide (SANDOSTATIN) 500 mcg in sodium chloride 0.9 % 250 mL (2 mcg/mL) infusion (50 mcg/hr Intravenous New Bag/Given 08/16/15 1630)  lactulose (CHRONULAC) 10 GM/15ML solution 10 g (10 g Oral Given 08/16/15 2135)  busPIRone (BUSPAR) tablet 7.5 mg (not administered)  artificial tears (LACRILUBE) ophthalmic ointment 1 application (not administered)  oxybutynin (DITROPAN-XL) 24 hr tablet 5 mg (not administered)  DULoxetine (CYMBALTA) DR capsule 60 mg (not administered)  0.9 %  sodium chloride infusion ( Intravenous New Bag/Given 08/16/15 2135)  acetaminophen (TYLENOL) tablet 650 mg (not administered)    Or  acetaminophen (TYLENOL) suppository 650 mg (not administered)  oxyCODONE (Oxy IR/ROXICODONE) immediate release tablet 5 mg (not administered)  HYDROmorphone (DILAUDID) injection 0.5-1 mg (not administered)  ondansetron (ZOFRAN) tablet 4 mg (not  administered)    Or  ondansetron (ZOFRAN) injection 4 mg (not administered)  alum & mag hydroxide-simeth (MAALOX/MYLANTA) 200-200-20 MG/5ML suspension 30 mL (not administered)  pantoprazole (PROTONIX) 80 mg in sodium chloride 0.9 % 250 mL (0.32 mg/mL) infusion (8 mg/hr Intravenous New Bag/Given 08/16/15 2142)  pantoprazole (PROTONIX) injection 40 mg (not administered)  cefTRIAXone (ROCEPHIN) 2 g in dextrose 5 % 50  mL IVPB (not administered)  insulin aspart (novoLOG) injection 0-9 Units (0 Units Subcutaneous Not Given 08/16/15 2115)  sodium chloride 0.9 % bolus 1,000 mL (0 mLs Intravenous Stopped 08/16/15 1736)  pantoprazole (PROTONIX) injection 40 mg (40 mg Intravenous Given 08/16/15 1612)  cefTRIAXone (ROCEPHIN) 1 g in dextrose 5 % 50 mL IVPB (0 g Intravenous Stopped 08/16/15 1858)    Filed Vitals:   08/16/15 1906 08/16/15 1915 08/16/15 2003 08/16/15 2200  BP: 128/82 118/68 125/74 105/46  Pulse: 98  89 88  Temp: 98.5 F (36.9 C)     TempSrc: Oral     Resp: 20 18 24 20   Height:   4\' 11"  (1.499 m)   Weight:   176 lb 2.4 oz (79.9 kg)   SpO2: 98%  96% 96%    Final diagnoses:  Upper GI bleed    Admission/ observation were discussed with the admitting physician, patient and/or family and they are comfortable with the plan.    Deno Etienne, DO 08/16/15 2243

## 2015-08-16 NOTE — H&P (Signed)
[facility] Admission History and Physical       Stacy Richard [id number] DOB: Jan 10, 1946 DOA: 08/16/2015  Referring physician: EDP PCP: Chesley Noon, MD  Specialists:   Chief Complaint: Vomiting Blood  HPI: Stacy Richard is a 69 y.o. female with a history of Hepatic Cirrhosis, Hep C, NIDDM, HTN who presents to the ED with complaints of hematemesis x 4 episodes during the day while at her doctors appointment.   She was sent from the Office to the ED.   She reports having indigestion like symptoms last night, but denies having ABD pain , denies passing melena.  She was evaluated in the ED and was found to have an initial hemoglobin level of 12.0.   Gastroenterology on Call Dr Kalman Shan was consulted and is to see in the AM, and patient was started on and IV Octreotide drip and and IV Protonix Drip and referred for admission.          Review of Systems:  Constitutional: No Weight Loss, No Weight Gain, Night Sweats, Fevers, Chills, Dizziness, Light Headedness, Fatigue, or Generalized Weakness HEENT: No Headaches, Difficulty Swallowing,Tooth/Dental Problems,Sore Throat,  No Sneezing, Rhinitis, Ear Ache, Nasal Congestion, or Post Nasal Drip,  Cardio-vascular:  No Chest pain, Orthopnea, PND, Edema in Lower Extremities, Anasarca, Dizziness, Palpitations  Resp: No Dyspnea, No DOE, No Productive Cough, No Non-Productive Cough, No Hemoptysis, No Wheezing.    GI: No Heartburn, +Indigestion, Abdominal Pain, Nausea, Vomiting, Diarrhea, Constipation, +Hematemesis, Hematochezia, Melena, Change in Bowel Habits,  Loss of Appetite  GU: No Dysuria, No Change in Color of Urine, No Urgency or Urinary Frequency, No Flank pain.  Musculoskeletal: No Joint Pain or Swelling, No Decreased Range of Motion, No Back Pain.  Neurologic: No Syncope, No Seizures, Muscle Weakness, Paresthesia, Vision Disturbance or Loss, No Diplopia, No Vertigo, No Difficulty Walking,  Skin: No Rash or Lesions. Psych: No  Change in Mood or Affect, No Depression or Anxiety, No Memory loss, No Confusion, or Hallucinations   Past Medical History  Diagnosis Date  . GERD (gastroesophageal reflux disease)   . Allergy     Rhinitis  . Hepatitis C   . Osteopenia   . NIDDM (non-insulin dependent diabetes mellitus)   . Osteoarthritis of knee     right knee  . Hypertension   . Arthritis     left hip     Past Surgical History  Procedure Laterality Date  . Knee surgery    . Transthoracic echocardiogram  09/2012    EF 55-54%, mild LVH & mild conc hypertrophy, grade 1 diastolic dysfunction; mild MR  . Nm myocar perf wall motion  09/2012    lexiscan myoview; normal study, low risk      Prior to Admission medications   Medication Sig Start Date End Date Taking? Authorizing Provider  Artificial Tear Ointment (ARTIFICIAL TEARS) ointment Place 1 application into both eyes as needed.    Yes Historical Provider, MD  aspirin 81 MG tablet Take 81 mg by mouth daily.     Yes Historical Provider, MD  busPIRone (BUSPAR) 7.5 MG tablet Take 1 tablet (7.5 mg total) by mouth 2 (two) times daily as needed. Patient taking differently: Take 7.5 mg by mouth daily.  07/17/15  Yes Merian Capron, MD  Calcium Carb-Cholecalciferol (CALTRATE 600+D) 600-800 MG-UNIT TABS Take 1 tablet by mouth 2 (two) times daily.   Yes Historical Provider, MD  calcium carbonate (TUMS - DOSED IN MG ELEMENTAL CALCIUM) 500 MG chewable tablet Chew 2 tablets by mouth  3 (three) times daily as needed for indigestion or heartburn.   Yes Historical Provider, MD  DULoxetine (CYMBALTA) 60 MG capsule Take 60 mg by mouth daily.     Yes Historical Provider, MD  erythromycin ophthalmic ointment Place 1 application into both eyes at bedtime.   Yes Historical Provider, MD  lactulose (CHRONULAC) 10 GM/15ML solution Take 10 g by mouth 2 (two) times daily.   Yes Historical Provider, MD  loratadine (CLARITIN) 10 MG tablet Take 10 mg by mouth daily.   Yes Historical Provider, MD   Multiple Vitamin (MULTIVITAMIN WITH MINERALS) TABS tablet Take 1 tablet by mouth daily. Multivitamin with lutein and lycopene   Yes Historical Provider, MD  Omega-3 Fatty Acids (FISH OIL) 1000 MG CAPS Take 1 capsule by mouth daily.   Yes Historical Provider, MD  oxybutynin (DITROPAN-XL) 5 MG 24 hr tablet Take 5 mg by mouth daily.   Yes Historical Provider, MD  Polyethyl Glycol-Propyl Glycol (SYSTANE OP) Apply 1 drop to eye daily as needed (dry eye).    Yes Historical Provider, MD  triamterene-hydrochlorothiazide (DYAZIDE) 37.5-25 MG per capsule Take 1 each (1 capsule total) by mouth daily. *MUST MAKE APPOINTMENT* 01/18/15  Yes Pixie Casino, MD  lisinopril (PRINIVIL,ZESTRIL) 40 MG tablet TAKE 1 TABLET BY MOUTH DAILY 11/04/14   Pixie Casino, MD  pantoprazole (PROTONIX) 40 MG tablet Take 1 tablet (40 mg total) by mouth daily. NEEDS APPOINTMENT FOR FUTURE REFILLS 01/10/15   Troy Sine, MD     No Known Allergies  Social History:  reports that she quit smoking about 5 years ago. She has never used smokeless tobacco. She reports that she uses illicit drugs (Marijuana). She reports that she does not drink alcohol.    Family History  Problem Relation Age of Onset  . Heart failure Mother   . Hepatitis Mother   . Kidney cancer Mother   . Heart disease Father   . Heart Problems Sister     murmur  . Diabetes type II Sister   . Heart Problems Daughter     murmur  . Bipolar disorder Daughter        Physical Exam:  GEN:  Pleasant Obese 69 y.o.  female examined and in no acute distress; cooperative with exam Filed Vitals:   08/16/15 1800 08/16/15 1815 08/16/15 1906 08/16/15 1915  BP: 104/71 113/61 128/82 118/68  Pulse: 105 98 98   Temp:   98.5 F (36.9 C)   TempSrc:   Oral   Resp: 25 22 20 18   SpO2: 98% 99% 98%    Blood pressure 118/68, pulse 98, temperature 98.5 F (36.9 C), temperature source Oral, resp. rate 18, SpO2 98 %. PSYCH: She is alert and oriented x4; does not appear  anxious does not appear depressed; affect is normal HEENT: Normocephalic and Atraumatic, Mucous membranes pink; PERRLA; EOM intact; Fundi:  Benign;  No scleral icterus, Nares: Patent, Oropharynx: Clear, Fair Dentition,    Neck:  FROM, No Cervical Lymphadenopathy nor Thyromegaly or Carotid Bruit; No JVD; Breasts:: Not examined CHEST WALL: No tenderness CHEST: Normal respiration, clear to auscultation bilaterally HEART: Regular rate and rhythm; no murmurs rubs or gallops BACK: No kyphosis or scoliosis; No CVA tenderness ABDOMEN: Positive Bowel Sounds, Obese, Soft Non-Tender, No Rebound or Guarding; No Masses, No Organomegaly, No Pannus; No Intertriginous candida. Rectal Exam: Not done EXTREMITIES: No Cyanosis, Clubbing, or Edema; No Ulcerations. Genitalia: not examined PULSES: 2+ and symmetric SKIN: Normal hydration no rash or ulceration CNS:  Alert and Oriented x 4, No Focal Deficits Vascular: pulses palpable throughout    Labs on Admission:  Basic Metabolic Panel:  Recent Labs Lab 08/16/15 1554 08/16/15 1607  NA 136 140  K 4.1 4.1  CL 100* 100*  CO2 23  --   GLUCOSE 218* 216*  BUN 26* 32*  CREATININE 1.02* 0.80  CALCIUM 8.7*  --    Liver Function Tests:  Recent Labs Lab 08/16/15 1554  AST 118*  ALT 80*  ALKPHOS 210*  BILITOT 1.2  PROT 8.2*  ALBUMIN 3.1*   No results for input(s): LIPASE, AMYLASE in the last 168 hours. No results for input(s): AMMONIA in the last 168 hours. CBC:  Recent Labs Lab 08/16/15 1554 08/16/15 1607  WBC 11.8*  --   NEUTROABS 7.4  --   HGB 12.0 13.9  HCT 36.3 41.0  MCV 95.8  --   PLT 154  --    Cardiac Enzymes: No results for input(s): CKTOTAL, CKMB, CKMBINDEX, TROPONINI in the last 168 hours.  BNP (last 3 results) No results for input(s): BNP in the last 8760 hours.  ProBNP (last 3 results) No results for input(s): PROBNP in the last 8760 hours.  CBG: No results for input(s): GLUCAP in the last 168 hours.  Radiological  Exams on Admission: No results found.      Assessment/Plan:      69 y.o. female with  Principal Problem:   1.    Upper GI bleed- due to Variceal Bleeding  And Portal HTN   IV Protonix Drip   IV Octreotide   Monitor H/Hs and Transfuse PRN   Check Coags   GI (Dr Kalman Shan) consult in AM   Active Problems:   2.    Hepatitis C/ Hepatic cirrhosis (HCC)   Not on Rx     3.   Type 2 diabetes mellitus ([place])- not on Rx   SSI coverage PRN   Check HbA1C in AM     4.    HTN (hypertension)   On Triamterene/HCTZ and Lisinopril Rx   Monitor BPs     5.    CAD (coronary artery disease)   Stable   Hold ASA due to #1     6.     DVT Prophylaxis   SCDs      Code Status:     FULL CODE    Family Communication:   Family/Friend at Bedside   No Family Present    Disposition Plan:    Inpatient  Observation Status        Time spent:  2 Minutes      Theressa Millard [facility] Pager [phone]   If [place] Please Contact the Day Rounding Team MD for [facility]  If 7PM-7AM, Please Contact Night-Floor Coverage  [email or web address] Password [email or web address] 08/16/2015, 9:04 PM     ADDENDUM:   Patient was seen and examined on 08/16/2015

## 2015-08-16 NOTE — ED Notes (Signed)
Per [facility] pt from home  Co hematemesis prior arrival , 200 cc bright blood per ems. Vs 88/44 BP. Hx hepatitis C. Liver cirrhosis. Alert and oriented x 4.

## 2015-08-17 ENCOUNTER — Inpatient Hospital Stay (HOSPITAL_COMMUNITY): Payer: Medicare HMO | Admitting: Anesthesiology

## 2015-08-17 ENCOUNTER — Encounter (HOSPITAL_COMMUNITY)
Admission: EM | Disposition: A | Discharge status: Home / Self Care | Payer: Self-pay | Source: Home / Self Care | Attending: Internal Medicine

## 2015-08-17 ENCOUNTER — Encounter (HOSPITAL_COMMUNITY): Payer: Self-pay | Admitting: *Deleted

## 2015-08-17 DIAGNOSIS — E1169 Type 2 diabetes mellitus with other specified complication: Secondary | ICD-10-CM

## 2015-08-17 DIAGNOSIS — I2510 Atherosclerotic heart disease of native coronary artery without angina pectoris: Secondary | ICD-10-CM

## 2015-08-17 DIAGNOSIS — B182 Chronic viral hepatitis C: Secondary | ICD-10-CM

## 2015-08-17 DIAGNOSIS — K7469 Other cirrhosis of liver: Secondary | ICD-10-CM

## 2015-08-17 DIAGNOSIS — K922 Gastrointestinal hemorrhage, unspecified: Secondary | ICD-10-CM

## 2015-08-17 DIAGNOSIS — I10 Essential (primary) hypertension: Secondary | ICD-10-CM

## 2015-08-17 HISTORY — PX: ESOPHAGEAL BANDING: SHX5518

## 2015-08-17 HISTORY — PX: ESOPHAGOGASTRODUODENOSCOPY (EGD) WITH PROPOFOL: SHX5813

## 2015-08-17 LAB — COMPREHENSIVE METABOLIC PANEL
ALBUMIN: 2.2 g/dL — AB (ref 3.5–5.0)
ALK PHOS: 105 U/L (ref 38–126)
ALT: 57 U/L — AB (ref 14–54)
AST: 79 U/L — ABNORMAL HIGH (ref 15–41)
Anion gap: 3 — ABNORMAL LOW (ref 5–15)
BUN: 17 mg/dL (ref 6–20)
CALCIUM: 7.2 mg/dL — AB (ref 8.9–10.3)
CHLORIDE: 108 mmol/L (ref 101–111)
CO2: 26 mmol/L (ref 22–32)
CREATININE: 0.61 mg/dL (ref 0.44–1.00)
GFR calc non Af Amer: >60 mL/min (ref >60)
GLUCOSE: 128 mg/dL — AB (ref 65–99)
Potassium: 3.2 mmol/L — ABNORMAL LOW (ref 3.5–5.1)
SODIUM: 137 mmol/L (ref 135–145)
Total Bilirubin: 0.7 mg/dL (ref 0.3–1.2)
Total Protein: 5.9 g/dL — ABNORMAL LOW (ref 6.5–8.1)

## 2015-08-17 LAB — GLUCOSE, CAPILLARY
GLUCOSE-CAPILLARY: 108 mg/dL — AB (ref 65–99)
GLUCOSE-CAPILLARY: 140 mg/dL — AB (ref 65–99)
Glucose-Capillary: 101 mg/dL — ABNORMAL HIGH (ref 65–99)
Glucose-Capillary: 114 mg/dL — ABNORMAL HIGH (ref 65–99)
Glucose-Capillary: 115 mg/dL — ABNORMAL HIGH (ref 65–99)
Glucose-Capillary: 160 mg/dL — ABNORMAL HIGH (ref 65–99)

## 2015-08-17 LAB — CBC
HCT: 23.5 % — ABNORMAL LOW (ref 36.0–46.0)
HEMATOCRIT: 25.7 % — AB (ref 36.0–46.0)
Hemoglobin: 8 g/dL — ABNORMAL LOW (ref 12.0–15.0)
Hemoglobin: 8.5 g/dL — ABNORMAL LOW (ref 12.0–15.0)
MCH: 32 pg (ref 26.0–34.0)
MCH: 31.8 pg (ref 26.0–34.0)
MCHC: 34 g/dL (ref 30.0–36.0)
MCHC: 33.1 g/dL (ref 30.0–36.0)
MCV: 94 fL (ref 78.0–100.0)
MCV: 96.3 fL (ref 78.0–100.0)
PLATELETS: 87 10*3/uL — AB (ref 150–400)
Platelets: 91 10*3/uL — ABNORMAL LOW (ref 150–400)
RBC: 2.5 MIL/uL — ABNORMAL LOW (ref 3.87–5.11)
RBC: 2.67 MIL/uL — ABNORMAL LOW (ref 3.87–5.11)
RDW: 13.8 % (ref 11.5–15.5)
RDW: 14.1 % (ref 11.5–15.5)
WBC: 8 10*3/uL (ref 4.0–10.5)
WBC: 5.5 10*3/uL (ref 4.0–10.5)

## 2015-08-17 SURGERY — ESOPHAGOGASTRODUODENOSCOPY (EGD) WITH PROPOFOL
Anesthesia: Monitor Anesthesia Care

## 2015-08-17 MED ORDER — PROPRANOLOL HCL ER 60 MG PO CP24
60.0000 mg | ORAL_CAPSULE | Freq: Every day | ORAL | Status: DC
  Administered 2015-08-17 – 2015-08-20 (×3): 60 mg via ORAL
  Filled 2015-08-17 (×6): qty 1

## 2015-08-17 MED ORDER — PROPOFOL 10 MG/ML IV BOLUS
INTRAVENOUS | Status: DC | PRN
  Administered 2015-08-17 (×3): 50 mg via INTRAVENOUS

## 2015-08-17 MED ORDER — INSULIN ASPART 100 UNIT/ML ~~LOC~~ SOLN
0.0000 [IU] | Freq: Three times a day (TID) | SUBCUTANEOUS | Status: DC
  Administered 2015-08-17: 2 [IU] via SUBCUTANEOUS
  Administered 2015-08-18 (×2): 1 [IU] via SUBCUTANEOUS
  Administered 2015-08-19 (×2): 2 [IU] via SUBCUTANEOUS
  Administered 2015-08-19 – 2015-08-20 (×2): 1 [IU] via SUBCUTANEOUS
  Administered 2015-08-21: 2 [IU] via SUBCUTANEOUS

## 2015-08-17 MED ORDER — PANTOPRAZOLE SODIUM 40 MG PO TBEC
40.0000 mg | DELAYED_RELEASE_TABLET | Freq: Every day | ORAL | Status: DC
  Administered 2015-08-17 – 2015-08-21 (×5): 40 mg via ORAL
  Filled 2015-08-17 (×8): qty 1

## 2015-08-17 MED ORDER — FENTANYL CITRATE (PF) 100 MCG/2ML IJ SOLN: 25.0000 ug | INTRAMUSCULAR | Status: DC | PRN

## 2015-08-17 MED ORDER — SODIUM CHLORIDE 0.9 % IV SOLN: INTRAVENOUS | Status: DC

## 2015-08-17 MED ORDER — LACTATED RINGERS IV SOLN
INTRAVENOUS | Status: DC
  Administered 2015-08-17: 10:00:00 via INTRAVENOUS

## 2015-08-17 MED ORDER — PROPOFOL 10 MG/ML IV BOLUS
INTRAVENOUS | Status: AC
  Filled 2015-08-17: qty 20

## 2015-08-17 SURGICAL SUPPLY — 14 items

## 2015-08-17 NOTE — Anesthesia Postprocedure Evaluation (Signed)
Anesthesia Post Note  Patient: Stacy Richard  Procedure(s) Performed: Procedure(s) (LRB): ESOPHAGOGASTRODUODENOSCOPY (EGD) WITH PROPOFOL (N/A) ESOPHAGEAL BANDING (N/A)  Patient location during evaluation: PACU Anesthesia Type: MAC Level of consciousness: awake and alert Pain management: pain level controlled Vital Signs Assessment: post-procedure vital signs reviewed and stable Respiratory status: spontaneous breathing, nonlabored ventilation, respiratory function stable and patient connected to nasal cannula oxygen Cardiovascular status: blood pressure returned to baseline and stable Postop Assessment: no signs of nausea or vomiting Anesthetic complications: no    Last Vitals:  Filed Vitals:   08/17/15 1236 08/17/15 1300  BP:  120/46  Pulse: 76 72  Temp:    Resp: 24 13    Last Pain:  Filed Vitals:   08/17/15 1321  PainSc: 0-No pain                 Mera Gunkel L

## 2015-08-17 NOTE — Consult Note (Addendum)
Reason for Consult: Upper GI bleeding Referring Physician:  Hospital team  Stacy Richard is an 69 y.o. female.  HPI:  Patient  Seen and examined and her office computer chart  And hospital computer chart  Was reviewed and she has not had anyfurther bledng here and has not had any black stools and has not had an other GI symptoms at home and is on an aspirin a day but no addiionl nonsteroidalsi er endoscopy last year wasreviewedand she has no oher complaints  Past Medical History  Diagnosis Date  . GERD (gastroesophageal reflux disease)   . Allergy     Rhinitis  . Hepatitis C   . Osteopenia   . NIDDM (non-insulin dependent diabetes mellitus)   . Osteoarthritis of knee     right knee  . Hypertension   . Arthritis     left hip    Past Surgical History  Procedure Laterality Date  . Knee surgery    . Transthoracic echocardiogram  09/2012    EF 55-54%, mild LVH & mild conc hypertrophy, grade 1 diastolic dysfunction; mild MR  . Nm myocar perf wall motion  09/2012    lexiscan myoview; normal study, low risk    Family History  Problem Relation Age of Onset  . Heart failure Mother   . Hepatitis Mother   . Kidney cancer Mother   . Heart disease Father   . Heart Problems Sister     murmur  . Diabetes type II Sister   . Heart Problems Daughter     murmur  . Bipolar disorder Daughter     Social History:  reports that she quit smoking about 5 years ago. She has never used smokeless tobacco. She reports that she uses illicit drugs (Marijuana). She reports that she does not drink alcohol.  Allergies: No Known Allergies  Medications: I have reviewed the patient's current medications.  Results for orders placed or performed during the hospital encounter of 08/16/15 (from the past 48 hour(s))  ABO/Rh     Status: None   Collection Time: 08/16/15  3:47 PM  Result Value Ref Range   ABO/RH(D) O POS   Type and screen     Status: None   Collection Time: 08/16/15  3:50 PM  Result Value Ref  Range   ABO/RH(D) O POS    Antibody Screen NEG    Sample Expiration 08/19/2015   CBC with Differential     Status: Abnormal   Collection Time: 08/16/15  3:54 PM  Result Value Ref Range   WBC 11.8 (H) 4.0 - 10.5 K/uL   RBC 3.79 (L) 3.87 - 5.11 MIL/uL   Hemoglobin 12.0 12.0 - 15.0 g/dL   HCT 36.3 36.0 - 46.0 %   MCV 95.8 78.0 - 100.0 fL   MCH 31.7 26.0 - 34.0 pg   MCHC 33.1 30.0 - 36.0 g/dL   RDW 13.9 11.5 - 15.5 %   Platelets 154 150 - 400 K/uL   Neutrophils Relative % 63 %   Neutro Abs 7.4 1.7 - 7.7 K/uL   Lymphocytes Relative 30 %   Lymphs Abs 3.6 0.7 - 4.0 K/uL   Monocytes Relative 7 %   Monocytes Absolute 0.8 0.1 - 1.0 K/uL   Eosinophils Relative 0 %   Eosinophils Absolute 0.1 0.0 - 0.7 K/uL   Basophils Relative 0 %   Basophils Absolute 0.0 0.0 - 0.1 K/uL  Comprehensive metabolic panel     Status: Abnormal   Collection Time: 08/16/15  3:54 PM  Result Value Ref Range   Sodium 136 135 - 145 mmol/L   Potassium 4.1 3.5 - 5.1 mmol/L   Chloride 100 (L) 101 - 111 mmol/L   CO2 23 22 - 32 mmol/L   Glucose, Bld 218 (H) 65 - 99 mg/dL   BUN 26 (H) 6 - 20 mg/dL   Creatinine, Ser 1.02 (H) 0.44 - 1.00 mg/dL   Calcium 8.7 (L) 8.9 - 10.3 mg/dL   Total Protein 8.2 (H) 6.5 - 8.1 g/dL   Albumin 3.1 (L) 3.5 - 5.0 g/dL   AST 118 (H) 15 - 41 U/L   ALT 80 (H) 14 - 54 U/L   Alkaline Phosphatase 210 (H) 38 - 126 U/L   Total Bilirubin 1.2 0.3 - 1.2 mg/dL   GFR calc non Af Amer 55 (L) >60 mL/min   GFR calc Af Amer >60 >60 mL/min    Comment: (NOTE) The eGFR has been calculated using the CKD EPI equation. This calculation has not been validated in all clinical situations. eGFR's persistently <60 mL/min signify possible Chronic Kidney Disease.    Anion gap 13 5 - 15  Protime-INR     Status: Abnormal   Collection Time: 08/16/15  3:54 PM  Result Value Ref Range   Prothrombin Time 16.5 (H) 11.6 - 15.2 seconds   INR 1.32 0.00 - 1.49  APTT     Status: None   Collection Time: 08/16/15  3:54  PM  Result Value Ref Range   aPTT 29 24 - 37 seconds  I-stat chem 8, ed     Status: Abnormal   Collection Time: 08/16/15  4:07 PM  Result Value Ref Range   Sodium 140 135 - 145 mmol/L   Potassium 4.1 3.5 - 5.1 mmol/L   Chloride 100 (L) 101 - 111 mmol/L   BUN 32 (H) 6 - 20 mg/dL   Creatinine, Ser 0.80 0.44 - 1.00 mg/dL   Glucose, Bld 216 (H) 65 - 99 mg/dL   Calcium, Ion 1.04 (L) 1.13 - 1.30 mmol/L   TCO2 25 0 - 100 mmol/L   Hemoglobin 13.9 12.0 - 15.0 g/dL   HCT 41.0 36.0 - 46.0 %  Occult blood, poc device     Status: None   Collection Time: 08/16/15  7:56 PM  Result Value Ref Range   Fecal Occult Bld NEGATIVE NEGATIVE  MRSA PCR Screening     Status: None   Collection Time: 08/16/15  8:10 PM  Result Value Ref Range   MRSA by PCR NEGATIVE NEGATIVE    Comment:        The GeneXpert MRSA Assay (FDA approved for NASAL specimens only), is one component of a comprehensive MRSA colonization surveillance program. It is not intended to diagnose MRSA infection nor to guide or monitor treatment for MRSA infections.   Glucose, capillary     Status: Abnormal   Collection Time: 08/16/15 11:43 PM  Result Value Ref Range   Glucose-Capillary 227 (H) 65 - 99 mg/dL   Comment 1 Notify RN   CBC     Status: Abnormal   Collection Time: 08/17/15  3:46 AM  Result Value Ref Range   WBC 8.0 4.0 - 10.5 K/uL   RBC 2.50 (L) 3.87 - 5.11 MIL/uL   Hemoglobin 8.0 (L) 12.0 - 15.0 g/dL    Comment: REPEATED TO VERIFY DELTA CHECK NOTED    HCT 23.5 (L) 36.0 - 46.0 %   MCV 94.0 78.0 - 100.0 fL  MCH 32.0 26.0 - 34.0 pg   MCHC 34.0 30.0 - 36.0 g/dL   RDW 13.8 11.5 - 15.5 %   Platelets 87 (L) 150 - 400 K/uL    Comment: SPECIMEN CHECKED FOR CLOTS REPEATED TO VERIFY DELTA CHECK NOTED PLATELET COUNT CONFIRMED BY SMEAR   Comprehensive metabolic panel     Status: Abnormal   Collection Time: 08/17/15  3:46 AM  Result Value Ref Range   Sodium 137 135 - 145 mmol/L   Potassium 3.2 (L) 3.5 - 5.1 mmol/L     Comment: DELTA CHECK NOTED REPEATED TO VERIFY    Chloride 108 101 - 111 mmol/L   CO2 26 22 - 32 mmol/L   Glucose, Bld 128 (H) 65 - 99 mg/dL   BUN 17 6 - 20 mg/dL   Creatinine, Ser 0.61 0.44 - 1.00 mg/dL   Calcium 7.2 (L) 8.9 - 10.3 mg/dL   Total Protein 5.9 (L) 6.5 - 8.1 g/dL   Albumin 2.2 (L) 3.5 - 5.0 g/dL   AST 79 (H) 15 - 41 U/L   ALT 57 (H) 14 - 54 U/L   Alkaline Phosphatase 105 38 - 126 U/L   Total Bilirubin 0.7 0.3 - 1.2 mg/dL   GFR calc non Af Amer >60 >60 mL/min   GFR calc Af Amer >60 >60 mL/min    Comment: (NOTE) The eGFR has been calculated using the CKD EPI equation. This calculation has not been validated in all clinical situations. eGFR's persistently <60 mL/min signify possible Chronic Kidney Disease.    Anion gap 3 (L) 5 - 15  Glucose, capillary     Status: Abnormal   Collection Time: 08/17/15  3:46 AM  Result Value Ref Range   Glucose-Capillary 115 (H) 65 - 99 mg/dL   Comment 1 Notify RN   Glucose, capillary     Status: Abnormal   Collection Time: 08/17/15  7:38 AM  Result Value Ref Range   Glucose-Capillary 108 (H) 65 - 99 mg/dL    No results found.  ROS negative except above Blood pressure 116/57, pulse 70, temperature 98.4 F (36.9 C), temperature source Oral, resp. rate 19, height _0  (1.499 m), weight 78.019 kg (172 lb), SpO2 98 %. Physical Exam vital signs stable afebrile no acute distress exam please see preassessment evaluation labs reviewed as was previous [place]  Assessment/Plan:  upper GI bleeding in a patient with  Cirrhosis on an aspirin a day  Plan:he risks and methods of endoscopy was discussed with the patent and will proceed this morning with further workup and plans pending those fidings as an aside she is followed by the [place] [facility] and does have a list of medicines which includes meloxicam which I would stop and she is not aware that she is ever tried beta blockers which I think she needs Kioni Stahl E 08/17/2015, 10:15  AM

## 2015-08-17 NOTE — Op Note (Addendum)
[facility] [place], [address]   ENDOSCOPY PROCEDURE REPORT  PATIENT: Stacy Richard, Stacy Richard  MR#: [id number] BIRTHDATE: 1945/12/19 , 69  yrs. old GENDER: female ENDOSCOPIST: Clarene Essex, MD REFERRED BY: PROCEDURE DATE:  08/17/2015 PROCEDURE:  EGD, diagnostic ASA CLASS:     Class III INDICATIONS:  hematemesis. in patient with cirrhosis and on an aspirin a day MEDICATIONS: Propofol 150 mg IV 20 mg lidocaine TOPICAL ANESTHETIC: none  DESCRIPTION OF PROCEDURE: After the risks benefits and alternatives of the procedure were thoroughly explained, informed consent was obtained.  The EG [id number] ) endoscope was introduced through the mouth and advanced to the second portion of the duodenum , Without limitations.  The instrument was slowly withdrawn as the mucosa was fully examined. Estimated blood loss is zero unless otherwise noted in this procedure report.    the findings are recorded below       Retroflexed views revealed no abnormalities.     The scope was then withdrawn from the patient and the procedure completed.  COMPLICATIONS: There were no immediate complications.  ENDOSCOPIC IMPRESSION: 1.Small distal esophageal varices without at risk stigmata2.Small hiatal hernia 3. Mild proximal portal gastropathy 4. Otherwise within normal limits EGD to the second portion of the duodenum without any signs of active bleeding  RECOMMENDATIONS: no aspirin or nonsteroidals long-termbegin low-dose beta blocker like Inderal  and titrate to a pulse of 60-70 as long as blood pressure okay and continue once a day pump inhibitors and will slowly advance diet  REPEAT EXAM: as needed  eSigned:  Clarene Essex, MD 08/17/2015 11:21 AM Revised: 08/17/2015 11:21 AM   [id number] Melford Aase, MD  CPT CODES: ICD CODES:  The ICD and CPT codes recommended by this software are interpretations from the data that the clinical staff has captured with the software.  The  verification of the translation of this report to the ICD and CPT codes and modifiers is the sole responsibility of the health care institution and practicing physician where this report was generated.  [employer] will not be held responsible for the validity of the ICD and CPT codes included on this report.  AMA assumes no liability for data contained or not contained herein. CPT is a Designer, television/film set of the [employer].  PATIENT NAME:  Stacy Richard, Stacy Richard MR#: [id number]

## 2015-08-17 NOTE — Progress Notes (Signed)
[facility] PROGRESS NOTE   Stacy Richard [id number] DOB: [birth year] DOA: 08/16/2015 PCP: Chesley Noon, MD  HPI/Subjective: Seen with nursing staff at bedside. Has had 4 episodes of hematemesis yesterday, last one was yesterday afternoon.  Assessment/Plan: Principal Problem:   Upper GI bleed Active Problems:   Hepatitis C   Type 2 diabetes mellitus (HCC)   HTN (hypertension)   CAD (coronary artery disease)   Hepatic cirrhosis (HCC)    Upper GI bleed Patient has history of cirrhosis, esophageal varices and portal hypertensive gastropathy. Came in with 4 episodes of hematemesis, hemoglobin dropped from 12.0 to 8.0. Admitted to stepdown, GI consulted. EGD done showed small esophageal varices without stigmata of bleeding, patient has mild portal hypertensive gastropathy. No aspirin or NSAIDs, continue nonselective beta blocker target heart rate of 60-70. Once a day Protonix.  Anemia Anemia of acute blood loss secondary to upper GI bleed. Hemoglobin dropped from 12.0 down to 8.0, will transfuse if hemoglobin drops less than 7.5. Check hemoglobin twice a day.  Type 2 diabetes mellitus Patient currently nothing by mouth, wall place on clear liquids. Check hemoglobin A1c, SSI.  Hepatic cirrhosis Hepatic cirrhosis and hepatitis C, compensated, not on treatment for hepatitis C. Also has hypoalbuminemia and thrombocytopenia.  Hypertension Blood pressure on the low side, hold antihypertensives.  Hypokalemia Replete with oral supplements.   Code Status: Full Code Family Communication: Plan discussed with the patient. Disposition Plan: Remains inpatient Diet: Diet NPO time specified  Consultants:  GI  Procedures:  EGD  Antibiotics:  None   Objective: Filed Vitals:   08/17/15 0932 08/17/15 1043  BP: 116/57 80/35  Pulse: 70 71  Temp: 98.4 F (36.9 C) 97.7 F (36.5 C)  Resp: 19 20    Intake/Output Summary (Last 24 hours) at 08/17/15  1114 Last data filed at 08/17/15 1043  Gross per 24 hour  Intake 2048.75 ml  Output    750 ml  Net 1298.75 ml   Filed Weights   08/16/15 2003 08/17/15 0932  Weight: 79.9 kg (176 lb 2.4 oz) 78.019 kg (172 lb)    Exam: General: Alert and awake, oriented x3, not in any acute distress. HEENT: anicteric sclera, pupils reactive to light and accommodation, EOMI CVS: S1-S2 clear, no murmur rubs or gallops Chest: clear to auscultation bilaterally, no wheezing, rales or rhonchi Abdomen: soft nontender, nondistended, normal bowel sounds, no organomegaly Extremities: no cyanosis, clubbing or edema noted bilaterally Neuro: Cranial nerves II-XII intact, no focal neurological deficits  Data Reviewed: Basic Metabolic Panel:  Recent Labs Lab 08/16/15 1554 08/16/15 1607 08/17/15 0346  NA 136 140 137  K 4.1 4.1 3.2*  CL 100* 100* 108  CO2 23  --  26  GLUCOSE 218* 216* 128*  BUN 26* 32* 17  CREATININE 1.02* 0.80 0.61  CALCIUM 8.7*  --  7.2*   Liver Function Tests:  Recent Labs Lab 08/16/15 1554 08/17/15 0346  AST 118* 79*  ALT 80* 57*  ALKPHOS 210* 105  BILITOT 1.2 0.7  PROT 8.2* 5.9*  ALBUMIN 3.1* 2.2*   No results for input(s): LIPASE, AMYLASE in the last 168 hours. No results for input(s): AMMONIA in the last 168 hours. CBC:  Recent Labs Lab 08/16/15 1554 08/16/15 1607 08/17/15 0346  WBC 11.8*  --  8.0  NEUTROABS 7.4  --   --   HGB 12.0 13.9 8.0*  HCT 36.3 41.0 23.5*  MCV 95.8  --  94.0  PLT 154  --  87*   Cardiac Enzymes:  No results for input(s): CKTOTAL, CKMB, CKMBINDEX, TROPONINI in the last 168 hours. BNP (last 3 results) No results for input(s): BNP in the last 8760 hours.  ProBNP (last 3 results) No results for input(s): PROBNP in the last 8760 hours.  CBG:  Recent Labs Lab 08/16/15 2343 08/17/15 0346 08/17/15 0738  GLUCAP 227* 115* 108*    Micro Recent Results (from the past 240 hour(s))  MRSA PCR Screening     Status: None   Collection  Time: 08/16/15  8:10 PM  Result Value Ref Range Status   MRSA by PCR NEGATIVE NEGATIVE Final    Comment:        The GeneXpert MRSA Assay (FDA approved for NASAL specimens only), is one component of a comprehensive MRSA colonization surveillance program. It is not intended to diagnose MRSA infection nor to guide or monitor treatment for MRSA infections.      Studies: No results found.  Scheduled Meds: . [MAR Hold] busPIRone  7.5 mg Oral Daily  . [MAR Hold] cefTRIAXone (ROCEPHIN)  IV  2 g Intravenous Q24H  . [MAR Hold] DULoxetine  60 mg Oral Daily  . [MAR Hold] insulin aspart  0-9 Units Subcutaneous 6 times per day  . [MAR Hold] lactulose  10 g Oral BID  . [MAR Hold] oxybutynin  5 mg Oral Daily  . [MAR Hold] pantoprazole (PROTONIX) IV  40 mg Intravenous Q12H   Continuous Infusions: . sodium chloride 75 mL/hr at 08/17/15 0600  . sodium chloride    . lactated ringers    . octreotide  (SANDOSTATIN)    IV infusion 50 mcg/hr (08/17/15 0131)  . pantoprozole (PROTONIX) infusion 8 mg/hr (08/17/15 1000)       Time spent: 35 minutes    [facility]  [facility] Pager [phone] If 7PM-7AM, please contact night-coverage at [email or web address], password [facility] 08/17/2015, 11:14 AM  LOS: 1 day

## 2015-08-17 NOTE — Anesthesia Preprocedure Evaluation (Addendum)
Anesthesia Evaluation  Patient identified by MRN, date of birth, ID band Patient awake    Reviewed: Allergy & Precautions, H&P , NPO status , Patient's Chart, lab work & pertinent test results  Airway Mallampati: II  TM Distance: >3 FB Neck ROM: full    Dental  (+) Dental Advisory Given, Edentulous Upper, Missing, Loose, Poor Dentition  2 loose lower teeth.  Many missing lower teeth:   Pulmonary neg pulmonary ROS, former smoker,    Pulmonary exam normal breath sounds clear to auscultation       Cardiovascular hypertension, Pt. on medications + CAD  Normal cardiovascular exam Rhythm:regular Rate:Normal     Neuro/Psych negative neurological ROS  negative psych ROS   GI/Hepatic negative GI ROS, GERD  Medicated and Controlled,(+) Hepatitis -, C  Endo/Other  diabetes, Well Controlled, Type 2  Renal/GU negative Renal ROS  negative genitourinary   Musculoskeletal   Abdominal   Peds  Hematology negative hematology ROS (+)   Anesthesia Other Findings   Reproductive/Obstetrics negative OB ROS                            Anesthesia Physical Anesthesia Plan  ASA: III  Anesthesia Plan: MAC   Post-op Pain Management:    Induction:   Airway Management Planned:   Additional Equipment:   Intra-op Plan:   Post-operative Plan:   Informed Consent: I have reviewed the patients History and Physical, chart, labs and discussed the procedure including the risks, benefits and alternatives for the proposed anesthesia with the patient or authorized representative who has indicated his/her understanding and acceptance.   Dental Advisory Given  Plan Discussed with: CRNA and Surgeon  Anesthesia Plan Comments:         Anesthesia Quick Evaluation

## 2015-08-17 NOTE — Care Management Note (Signed)
Case Management Note  Patient Details  Name: Stacy Richard MRN: [id number] Date of Birth: 1946/07/17  Subjective/Objective:           Gi bld with hypotensive state         Action/Plan:Date: August 17, 2015 Chart reviewed for concurrent status and case management needs. Will continue to follow patient for changes and needs: Velva Harman, RN, BSN, [place]   [phone]   Expected Discharge Date:                  Expected Discharge Plan:  Home/Self Care  In-House Referral:  NA  Discharge planning Services  CM Consult  Post Acute Care Choice:  NA Choice offered to:  NA  DME Arranged:  N/A DME Agency:  NA  HH Arranged:  NA HH Agency:  NA  Status of Service:  In process, will continue to follow  Medicare Important Message Given:    Date Medicare IM Given:    Medicare IM give by:    Date Additional Medicare IM Given:    Additional Medicare Important Message give by:     If discussed at [place] of Stay Meetings, dates discussed:    Additional Comments:  Leeroy Cha, RN 08/17/2015, 8:18 AM

## 2015-08-17 NOTE — Transfer of Care (Signed)
Immediate Anesthesia Transfer of Care Note  Patient: Stacy Richard  Procedure(s) Performed: Procedure(s) with comments: ESOPHAGOGASTRODUODENOSCOPY (EGD) WITH PROPOFOL (N/A) ESOPHAGEAL BANDING (N/A) - ? banding  Patient Location: PACU  Anesthesia Type:MAC  Level of Consciousness:  sedated, patient cooperative and responds to stimulation  Airway & Oxygen Therapy:Patient Spontanous Breathing   Post-op Assessment:  Report given to PACU RN and Post -op Vital signs reviewed and stable  Post vital signs:  Reviewed and stable  Last Vitals:  Filed Vitals:   08/17/15 0800 08/17/15 0932  BP: 94/45 116/57  Pulse: 76 70  Temp: 36.4 C 36.9 C  Resp: 15 19    Complications: No apparent anesthesia complications

## 2015-08-18 ENCOUNTER — Encounter (HOSPITAL_COMMUNITY): Payer: Self-pay | Admitting: Gastroenterology

## 2015-08-18 LAB — HEMOGLOBIN A1C
HEMOGLOBIN A1C: 6.4 % — AB (ref 4.8–5.6)
MEAN PLASMA GLUCOSE: 137 mg/dL

## 2015-08-18 LAB — CBC
HCT: 29.1 % — ABNORMAL LOW (ref 36.0–46.0)
HEMATOCRIT: 25 % — AB (ref 36.0–46.0)
HEMOGLOBIN: 9.6 g/dL — AB (ref 12.0–15.0)
Hemoglobin: 8.6 g/dL — ABNORMAL LOW (ref 12.0–15.0)
MCH: 32.8 pg (ref 26.0–34.0)
MCH: 32.2 pg (ref 26.0–34.0)
MCHC: 34.4 g/dL (ref 30.0–36.0)
MCHC: 33 g/dL (ref 30.0–36.0)
MCV: 95.4 fL (ref 78.0–100.0)
MCV: 97.7 fL (ref 78.0–100.0)
PLATELETS: 84 10*3/uL — AB (ref 150–400)
Platelets: 113 10*3/uL — ABNORMAL LOW (ref 150–400)
RBC: 2.62 MIL/uL — ABNORMAL LOW (ref 3.87–5.11)
RBC: 2.98 MIL/uL — AB (ref 3.87–5.11)
RDW: 14.3 % (ref 11.5–15.5)
RDW: 14.2 % (ref 11.5–15.5)
WBC: 5.7 10*3/uL (ref 4.0–10.5)
WBC: 7.2 10*3/uL (ref 4.0–10.5)

## 2015-08-18 LAB — BASIC METABOLIC PANEL
Anion gap: 3 — ABNORMAL LOW (ref 5–15)
BUN: 9 mg/dL (ref 6–20)
CO2: 25 mmol/L (ref 22–32)
Calcium: 7.6 mg/dL — ABNORMAL LOW (ref 8.9–10.3)
Chloride: 112 mmol/L — ABNORMAL HIGH (ref 101–111)
Creatinine, Ser: 0.74 mg/dL (ref 0.44–1.00)
GFR calc Af Amer: >60 mL/min (ref >60)
Glucose, Bld: 123 mg/dL — ABNORMAL HIGH (ref 65–99)
POTASSIUM: 3.7 mmol/L (ref 3.5–5.1)
SODIUM: 140 mmol/L (ref 135–145)

## 2015-08-18 LAB — GLUCOSE, CAPILLARY
GLUCOSE-CAPILLARY: 124 mg/dL — AB (ref 65–99)
Glucose-Capillary: 141 mg/dL — ABNORMAL HIGH (ref 65–99)
Glucose-Capillary: 82 mg/dL (ref 65–99)

## 2015-08-18 MED ORDER — POTASSIUM CHLORIDE CRYS ER 20 MEQ PO TBCR
40.0000 meq | EXTENDED_RELEASE_TABLET | Freq: Once | ORAL | Status: AC
  Administered 2015-08-18: 40 meq via ORAL
  Filled 2015-08-18: qty 2

## 2015-08-18 NOTE — Progress Notes (Signed)
[facility] PROGRESS NOTE   Stacy Richard [id number] DOB: [birth year] DOA: 08/16/2015 PCP: Chesley Noon, MD  HPI/Subjective: Denies any complaints, no nausea or vomiting. Had 1 bowel movement since yesterday.  Assessment/Plan: Principal Problem:   Upper GI bleed Active Problems:   Hepatitis C   Type 2 diabetes mellitus (HCC)   HTN (hypertension)   CAD (coronary artery disease)   Hepatic cirrhosis (HCC)    Upper GI bleed Patient has history of cirrhosis, esophageal varices and portal hypertensive gastropathy. Came in with 4 episodes of hematemesis, hemoglobin dropped from 12.0 to 8.0. Admitted to stepdown, GI consulted. EGD done showed small esophageal varices without stigmata of bleeding, patient has mild portal hypertensive gastropathy. No aspirin or NSAIDs, continue nonselective beta blocker target heart rate of 60-70. Switch Protonix to oral and once a day.  Anemia Anemia of acute blood loss secondary to upper GI bleed. Hemoglobin dropped from 12.0 down to 8.0, will transfuse if hemoglobin drops less than 7.5. Check hemoglobin twice a day. Hemoglobin today is 8.6. Discontinue IV fluids  Type 2 diabetes mellitus Patient currently nothing by mouth, wall place on clear liquids. Check hemoglobin A1c, SSI.  Hepatic cirrhosis Hepatic cirrhosis and hepatitis C, compensated, not on treatment for hepatitis C. Also has hypoalbuminemia and thrombocytopenia.  Hypertension Blood pressure on the low side, and tinea hold antihypertensives.  Hypokalemia Repleted with oral supplements.   Code Status: Full Code Family Communication: Plan discussed with the patient. Disposition Plan: Transfer to telemetry Diet: Diet clear liquid Room service appropriate?: Yes; Fluid consistency:: Thin  Consultants:  GI  Procedures:  EGD  Antibiotics:  None   Objective: Filed Vitals:   08/18/15 0400 08/18/15 0800  BP: 91/35 95/47  Pulse: 61 59  Temp:  98 F (36.7  C)  Resp: 22 19    Intake/Output Summary (Last 24 hours) at 08/18/15 1026 Last data filed at 08/18/15 0500  Gross per 24 hour  Intake   1675 ml  Output   2350 ml  Net   -675 ml   Filed Weights   08/16/15 2003 08/17/15 0932  Weight: 79.9 kg (176 lb 2.4 oz) 78.019 kg (172 lb)    Exam: General: Alert and awake, oriented x3, not in any acute distress. HEENT: anicteric sclera, pupils reactive to light and accommodation, EOMI CVS: S1-S2 clear, no murmur rubs or gallops Chest: clear to auscultation bilaterally, no wheezing, rales or rhonchi Abdomen: soft nontender, nondistended, normal bowel sounds, no organomegaly Extremities: no cyanosis, clubbing or edema noted bilaterally Neuro: Cranial nerves II-XII intact, no focal neurological deficits  Data Reviewed: Basic Metabolic Panel:  Recent Labs Lab 08/16/15 1554 08/16/15 1607 08/17/15 0346 08/18/15 0500  NA 136 140 137 140  K 4.1 4.1 3.2* 3.7  CL 100* 100* 108 112*  CO2 23  --  26 25  GLUCOSE 218* 216* 128* 123*  BUN 26* 32* 17 9  CREATININE 1.02* 0.80 0.61 0.74  CALCIUM 8.7*  --  7.2* 7.6*   Liver Function Tests:  Recent Labs Lab 08/16/15 1554 08/17/15 0346  AST 118* 79*  ALT 80* 57*  ALKPHOS 210* 105  BILITOT 1.2 0.7  PROT 8.2* 5.9*  ALBUMIN 3.1* 2.2*   No results for input(s): LIPASE, AMYLASE in the last 168 hours. No results for input(s): AMMONIA in the last 168 hours. CBC:  Recent Labs Lab 08/16/15 1554 08/16/15 1607 08/17/15 0346 08/17/15 1718 08/18/15 0500  WBC 11.8*  --  8.0 5.5 5.7  NEUTROABS 7.4  --   --   --   --  HGB 12.0 13.9 8.0* 8.5* 8.6*  HCT 36.3 41.0 23.5* 25.7* 25.0*  MCV 95.8  --  94.0 96.3 95.4  PLT 154  --  87* 91* 84*   Cardiac Enzymes: No results for input(s): CKTOTAL, CKMB, CKMBINDEX, TROPONINI in the last 168 hours. BNP (last 3 results) No results for input(s): BNP in the last 8760 hours.  ProBNP (last 3 results) No results for input(s): PROBNP in the last 8760  hours.  CBG:  Recent Labs Lab 08/17/15 1145 08/17/15 1702 08/17/15 2138 08/17/15 2334 08/18/15 0736  GLUCAP 114* 160* 140* 101* 124*    Micro Recent Results (from the past 240 hour(s))  MRSA PCR Screening     Status: None   Collection Time: 08/16/15  8:10 PM  Result Value Ref Range Status   MRSA by PCR NEGATIVE NEGATIVE Final    Comment:        The GeneXpert MRSA Assay (FDA approved for NASAL specimens only), is one component of a comprehensive MRSA colonization surveillance program. It is not intended to diagnose MRSA infection nor to guide or monitor treatment for MRSA infections.      Studies: No results found.  Scheduled Meds: . busPIRone  7.5 mg Oral Daily  . cefTRIAXone (ROCEPHIN)  IV  2 g Intravenous Q24H  . DULoxetine  60 mg Oral Daily  . insulin aspart  0-9 Units Subcutaneous TID WC  . lactulose  10 g Oral BID  . oxybutynin  5 mg Oral Daily  . pantoprazole  40 mg Oral Daily  . propranolol ER  60 mg Oral Daily   Continuous Infusions: . sodium chloride 75 mL/hr at 08/18/15 0200       Time spent: 35 minutes    [facility] A  [facility] Pager [phone] If 7PM-7AM, please contact night-coverage at [email or web address], password [facility] 08/18/2015, 10:26 AM  LOS: 2 days

## 2015-08-18 NOTE — Progress Notes (Signed)
[facility] PROGRESS NOTE Subjective EGD yesterday small varices w/o any bleeding, pt states she vomited CG material. Her last colon was 6 years ago in [place]. No pain  Objective: Vital signs in last 24 hours: Temp:  [98 F (36.7 C)-98.6 F (37 C)] 98 F (36.7 C) (12/09 0800) Pulse Rate:  [58-73] 59 (12/09 0800) Resp:  [13-23] 19 (12/09 0800) BP: (91-138)/(35-69) 95/47 mmHg (12/09 0800) SpO2:  [90 %-100 %] 91 % (12/09 0800) Last BM Date: 08/17/15  Intake/Output from previous day: 12/08 0701 - 12/09 0700 In: 1975 [I.V.:1925; IV Piggyback:50] Out: 2925 [Urine:2925] Intake/Output this shift: Total I/O In: 375 [I.V.:375] Out: -   PE:  Abdomen--nontender  Lab Results:  Recent Labs  08/16/15 1554 08/16/15 1607 08/17/15 0346 08/17/15 1718 08/18/15 0500  WBC 11.8*  --  8.0 5.5 5.7  HGB 12.0 13.9 8.0* 8.5* 8.6*  HCT 36.3 41.0 23.5* 25.7* 25.0*  PLT 154  --  87* 91* 84*   BMET  Recent Labs  08/16/15 1554 08/16/15 1607 08/17/15 0346 08/18/15 0500  NA 136 140 137 140  K 4.1 4.1 3.2* 3.7  CL 100* 100* 108 112*  CO2 23  --  26 25  CREATININE 1.02* 0.80 0.61 0.74   LFT  Recent Labs  08/16/15 1554 08/17/15 0346  PROT 8.2* 5.9*  AST 118* 79*  ALT 80* 57*  ALKPHOS 210* 105  BILITOT 1.2 0.7   PT/INR  Recent Labs  08/16/15 1554  LABPROT 16.5*  INR 1.32   PANCREAS No results for input(s): LIPASE in the last 72 hours.       Studies/Results: No results found.  Medications: I have reviewed the patient's current medications.  Assessment/Plan: 1. Hematemesis. EGD negative except small varices no blood she is passing dark stools Hg stable. BUN/creat are normal. Will keep on clears, ? Colonoscopy or GI bleeding scan if continues to bleed. Dr Paulita Fujita  Will see tomorrow.   Annastasia Haskins JR,Makila Colombe L 08/18/2015, 12:37 PM  Pager: [phone] If no answer or after hours call [phone]

## 2015-08-18 NOTE — Progress Notes (Signed)
Received from ICU 69 YO female [employer] voices no c/o

## 2015-08-18 NOTE — Progress Notes (Signed)
Hand off report called to April, RN.  Patient is transferring to room 1404 via wheelchair.

## 2015-08-19 LAB — GLUCOSE, CAPILLARY
GLUCOSE-CAPILLARY: 189 mg/dL — AB (ref 65–99)
GLUCOSE-CAPILLARY: 172 mg/dL — AB (ref 65–99)
Glucose-Capillary: 122 mg/dL — ABNORMAL HIGH (ref 65–99)

## 2015-08-19 LAB — CBC
HEMATOCRIT: 24.9 % — AB (ref 36.0–46.0)
HEMOGLOBIN: 8.3 g/dL — AB (ref 12.0–15.0)
MCH: 31.9 pg (ref 26.0–34.0)
MCHC: 33.3 g/dL (ref 30.0–36.0)
MCV: 95.8 fL (ref 78.0–100.0)
Platelets: 87 10*3/uL — ABNORMAL LOW (ref 150–400)
RBC: 2.6 MIL/uL — AB (ref 3.87–5.11)
RDW: 14.2 % (ref 11.5–15.5)
WBC: 5.3 10*3/uL (ref 4.0–10.5)

## 2015-08-19 LAB — BASIC METABOLIC PANEL
ANION GAP: 1 — AB (ref 5–15)
BUN: 7 mg/dL (ref 6–20)
CHLORIDE: 106 mmol/L (ref 101–111)
CO2: 26 mmol/L (ref 22–32)
Calcium: 7.6 mg/dL — ABNORMAL LOW (ref 8.9–10.3)
Creatinine, Ser: 0.78 mg/dL (ref 0.44–1.00)
GFR calc Af Amer: >60 mL/min (ref >60)
Glucose, Bld: 125 mg/dL — ABNORMAL HIGH (ref 65–99)
POTASSIUM: 3.8 mmol/L (ref 3.5–5.1)
SODIUM: 133 mmol/L — AB (ref 135–145)

## 2015-08-19 MED ORDER — POTASSIUM CHLORIDE CRYS ER 20 MEQ PO TBCR
40.0000 meq | EXTENDED_RELEASE_TABLET | Freq: Once | ORAL | Status: AC
  Administered 2015-08-19: 40 meq via ORAL
  Filled 2015-08-19: qty 2

## 2015-08-19 NOTE — Progress Notes (Signed)
CSW provided pt with Medicaid transportation and dental resources, per her request.  CSW to sign off, please re-consult, as necessary.  Creta Levin, LCSW (Coverage) [phone]

## 2015-08-19 NOTE — Progress Notes (Signed)
Subjective: No further hematemesis. No mild abdominal soreness. Some persistent black stools, but no hematochezia.  Objective: Vital signs in last 24 hours: Temp:  [98 F (36.7 C)-98.4 F (36.9 C)] 98.4 F (36.9 C) (12/10 0201) Pulse Rate:  [55-61] 61 (12/10 0926) Resp:  [18-20] 18 (12/10 0201) BP: (93-137)/(40-55) 129/49 mmHg (12/10 0926) SpO2:  [97 %-100 %] 97 % (12/10 0201) Weight change:  Last BM Date: 08/18/15  PE: GEN:  NAD ABD:  Soft, non-tender  Lab Results: CBC    Component Value Date/Time   WBC 5.3 08/19/2015 0448   RBC 2.60* 08/19/2015 0448   HGB 8.3* 08/19/2015 0448   HCT 24.9* 08/19/2015 0448   PLT 87* 08/19/2015 0448   MCV 95.8 08/19/2015 0448   MCH 31.9 08/19/2015 0448   MCHC 33.3 08/19/2015 0448   RDW 14.2 08/19/2015 0448   LYMPHSABS 3.6 08/16/2015 1554   MONOABS 0.8 08/16/2015 1554   EOSABS 0.1 08/16/2015 1554   BASOSABS 0.0 08/16/2015 1554   CMP     Component Value Date/Time   NA 133* 08/19/2015 0448   K 3.8 08/19/2015 0448   CL 106 08/19/2015 0448   CO2 26 08/19/2015 0448   GLUCOSE 125* 08/19/2015 0448   BUN 7 08/19/2015 0448   CREATININE 0.78 08/19/2015 0448   CALCIUM 7.6* 08/19/2015 0448   PROT 5.9* 08/17/2015 0346   ALBUMIN 2.2* 08/17/2015 0346   AST 79* 08/17/2015 0346   ALT 57* 08/17/2015 0346   ALKPHOS 105 08/17/2015 0346   BILITOT 0.7 08/17/2015 0346   GFRNONAA >60 08/19/2015 0448   GFRAA >60 08/19/2015 0448   Assessment:  1.  Hematemesis.  Resolved.  Suspect either portal gastropathy or low-grade variceal bleeding. 2.  Dark stools.  Persistent.  Suspect from portal gastropathy.  Doubt ongoing GI bleeding. 3.  Anemia, slight down-trend.  Perhaps equilibration.  Plan:  1.  Advance diet to full liquids. 2.  Continue pantoprazole and propranolol indefinitely. 3.  Follow Hgb.  If continues downtrend, might consider colonoscopy, but would suspect this test would be negative. 4.  [facility] will follow.   Stacy Richard 08/19/2015, 12:31 PM   Pager [phone] If no answer or after 5 PM call [phone]

## 2015-08-19 NOTE — Progress Notes (Signed)
[facility] PROGRESS NOTE   Stacy Richard [id number] DOB: 01/06/1946 DOA: 08/16/2015 PCP: Chesley Noon, MD  HPI/Subjective: Had 1 bloody bowel movement yesterday. No nausea or vomiting, tolerating clear liquids. Hemoglobin down to 8.3 compared to yesterday morning was 8.6.  Assessment/Plan: Principal Problem:   Upper GI bleed Active Problems:   Hepatitis C   Type 2 diabetes mellitus (HCC)   HTN (hypertension)   CAD (coronary artery disease)   Hepatic cirrhosis (HCC)    Upper GI bleed Patient has history of cirrhosis, esophageal varices and portal hypertensive gastropathy. Came in with 4 episodes of hematemesis, hemoglobin dropped from 12.0 to 8.0. Admitted to stepdown, GI consulted. EGD done showed small esophageal varices without stigmata of bleeding, patient has mild portal hypertensive gastropathy. No aspirin or NSAIDs, continue nonselective beta blocker target heart rate of 60-70. Switch Protonix to oral and once a day. Per GI continue to monitor she might need colonoscopy or capsule endoscopy.  Anemia Anemia of acute blood loss secondary to upper GI bleed. Hemoglobin dropped from 12.0 down to 8.0, will transfuse if hemoglobin drops less than 7.5. Check hemoglobin twice a day. Hemoglobin today is 8.3. Discontinue IV fluids  Type 2 diabetes mellitus Patient currently nothing by mouth, wall place on clear liquids. Check hemoglobin A1c, SSI.  Hepatic cirrhosis Hepatic cirrhosis and hepatitis C, compensated, not on treatment for hepatitis C. Also has hypoalbuminemia and thrombocytopenia. Started on Inderal, heart rate ranges between 55 and 65 bpm.  Hypertension Blood pressure on the low side, and tinea hold antihypertensives.  Hypokalemia Repleted with oral supplements.   Code Status: Full Code Family Communication: Plan discussed with the patient. Disposition Plan: Transfer to telemetry Diet: Diet clear liquid Room service appropriate?: Yes; Fluid  consistency:: Thin  Consultants:  GI  Procedures:  EGD  Antibiotics:  None   Objective: Filed Vitals:   08/19/15 0201 08/19/15 0926  BP: 93/40 129/49  Pulse: 55 61  Temp: 98.4 F (36.9 C)   Resp: 18    No intake or output data in the 24 hours ending 08/19/15 1027 Filed Weights   08/16/15 2003 08/17/15 0932  Weight: 79.9 kg (176 lb 2.4 oz) 78.019 kg (172 lb)    Exam: General: Alert and awake, oriented x3, not in any acute distress. HEENT: anicteric sclera, pupils reactive to light and accommodation, EOMI CVS: S1-S2 clear, no murmur rubs or gallops Chest: clear to auscultation bilaterally, no wheezing, rales or rhonchi Abdomen: soft nontender, nondistended, normal bowel sounds, no organomegaly Extremities: no cyanosis, clubbing or edema noted bilaterally Neuro: Cranial nerves II-XII intact, no focal neurological deficits  Data Reviewed: Basic Metabolic Panel:  Recent Labs Lab 08/16/15 1554 08/16/15 1607 08/17/15 0346 08/18/15 0500 08/19/15 0448  NA 136 140 137 140 133*  K 4.1 4.1 3.2* 3.7 3.8  CL 100* 100* 108 112* 106  CO2 23  --  26 25 26   GLUCOSE 218* 216* 128* 123* 125*  BUN 26* 32* 17 9 7   CREATININE 1.02* 0.80 0.61 0.74 0.78  CALCIUM 8.7*  --  7.2* 7.6* 7.6*   Liver Function Tests:  Recent Labs Lab 08/16/15 1554 08/17/15 0346  AST 118* 79*  ALT 80* 57*  ALKPHOS 210* 105  BILITOT 1.2 0.7  PROT 8.2* 5.9*  ALBUMIN 3.1* 2.2*   No results for input(s): LIPASE, AMYLASE in the last 168 hours. No results for input(s): AMMONIA in the last 168 hours. CBC:  Recent Labs Lab 08/16/15 1554  08/17/15 0346 08/17/15 1718 08/18/15 0500 08/18/15  1636 08/19/15 0448  WBC 11.8*  --  8.0 5.5 5.7 7.2 5.3  NEUTROABS 7.4  --   --   --   --   --   --   HGB 12.0  < > 8.0* 8.5* 8.6* 9.6* 8.3*  HCT 36.3  < > 23.5* 25.7* 25.0* 29.1* 24.9*  MCV 95.8  --  94.0 96.3 95.4 97.7 95.8  PLT 154  --  87* 91* 84* 113* 87*  < > = values in this interval not  displayed. Cardiac Enzymes: No results for input(s): CKTOTAL, CKMB, CKMBINDEX, TROPONINI in the last 168 hours. BNP (last 3 results) No results for input(s): BNP in the last 8760 hours.  ProBNP (last 3 results) No results for input(s): PROBNP in the last 8760 hours.  CBG:  Recent Labs Lab 08/17/15 2334 08/18/15 0736 08/18/15 1138 08/18/15 1717 08/19/15 0745  GLUCAP 101* 124* 141* 82 122*    Micro Recent Results (from the past 240 hour(s))  MRSA PCR Screening     Status: None   Collection Time: 08/16/15  8:10 PM  Result Value Ref Range Status   MRSA by PCR NEGATIVE NEGATIVE Final    Comment:        The GeneXpert MRSA Assay (FDA approved for NASAL specimens only), is one component of a comprehensive MRSA colonization surveillance program. It is not intended to diagnose MRSA infection nor to guide or monitor treatment for MRSA infections.      Studies: No results found.  Scheduled Meds: . busPIRone  7.5 mg Oral Daily  . DULoxetine  60 mg Oral Daily  . insulin aspart  0-9 Units Subcutaneous TID WC  . lactulose  10 g Oral BID  . oxybutynin  5 mg Oral Daily  . pantoprazole  40 mg Oral Daily  . propranolol ER  60 mg Oral Daily   Continuous Infusions:       Time spent: 35 minutes    [facility] A  [facility] Pager [phone] If 7PM-7AM, please contact night-coverage at [email or web address], password [facility] 08/19/2015, 10:27 AM  LOS: 3 days

## 2015-08-20 DIAGNOSIS — E119 Type 2 diabetes mellitus without complications: Secondary | ICD-10-CM

## 2015-08-20 LAB — BASIC METABOLIC PANEL
Anion gap: 4 — ABNORMAL LOW (ref 5–15)
BUN: 7 mg/dL (ref 6–20)
CALCIUM: 8.1 mg/dL — AB (ref 8.9–10.3)
CO2: 28 mmol/L (ref 22–32)
CREATININE: 0.71 mg/dL (ref 0.44–1.00)
Chloride: 107 mmol/L (ref 101–111)
GFR calc Af Amer: >60 mL/min (ref >60)
GLUCOSE: 118 mg/dL — AB (ref 65–99)
Potassium: 3.9 mmol/L (ref 3.5–5.1)
SODIUM: 139 mmol/L (ref 135–145)

## 2015-08-20 LAB — GLUCOSE, CAPILLARY
GLUCOSE-CAPILLARY: 108 mg/dL — AB (ref 65–99)
GLUCOSE-CAPILLARY: 128 mg/dL — AB (ref 65–99)
Glucose-Capillary: 132 mg/dL — ABNORMAL HIGH (ref 65–99)

## 2015-08-20 LAB — CBC
HCT: 24.6 % — ABNORMAL LOW (ref 36.0–46.0)
Hemoglobin: 8.4 g/dL — ABNORMAL LOW (ref 12.0–15.0)
MCH: 32.7 pg (ref 26.0–34.0)
MCHC: 34.1 g/dL (ref 30.0–36.0)
MCV: 95.7 fL (ref 78.0–100.0)
PLATELETS: 90 10*3/uL — AB (ref 150–400)
RBC: 2.57 MIL/uL — ABNORMAL LOW (ref 3.87–5.11)
RDW: 14.6 % (ref 11.5–15.5)
WBC: 5.4 10*3/uL (ref 4.0–10.5)

## 2015-08-20 MED ORDER — BISACODYL 5 MG PO TBEC: 10.0000 mg | DELAYED_RELEASE_TABLET | Freq: Every day | ORAL | Status: DC | PRN

## 2015-08-20 MED ORDER — PEG 3350-KCL-NA BICARB-NACL 420 G PO SOLR
2000.0000 mL | ORAL | Status: AC
  Administered 2015-08-20 (×2): 2000 mL via ORAL

## 2015-08-20 NOTE — Progress Notes (Signed)
Subjective: Some dark black stool overnight. No abdominal pain.  Objective: Vital signs in last 24 hours: Temp:  [97.7 F (36.5 C)-98.5 F (36.9 C)] 98.4 F (36.9 C) (12/11 0522) Pulse Rate:  [49-61] 51 (12/11 0522) Resp:  [18-20] 18 (12/11 0522) BP: (101-129)/(49-83) 101/56 mmHg (12/11 0522) SpO2:  [100 %] 100 % (12/11 0522) Weight change:  Last BM Date: 08/19/15  PE: GEN:  NAD ABD:  Soft, non-tender  Lab Results: CBC    Component Value Date/Time   WBC 5.4 08/20/2015 0510   RBC 2.57* 08/20/2015 0510   HGB 8.4* 08/20/2015 0510   HCT 24.6* 08/20/2015 0510   PLT 90* 08/20/2015 0510   MCV 95.7 08/20/2015 0510   MCH 32.7 08/20/2015 0510   MCHC 34.1 08/20/2015 0510   RDW 14.6 08/20/2015 0510   LYMPHSABS 3.6 08/16/2015 1554   MONOABS 0.8 08/16/2015 1554   EOSABS 0.1 08/16/2015 1554   BASOSABS 0.0 08/16/2015 1554   CMP     Component Value Date/Time   NA 139 08/20/2015 0510   K 3.9 08/20/2015 0510   CL 107 08/20/2015 0510   CO2 28 08/20/2015 0510   GLUCOSE 118* 08/20/2015 0510   BUN 7 08/20/2015 0510   CREATININE 0.71 08/20/2015 0510   CALCIUM 8.1* 08/20/2015 0510   PROT 5.9* 08/17/2015 0346   ALBUMIN 2.2* 08/17/2015 0346   AST 79* 08/17/2015 0346   ALT 57* 08/17/2015 0346   ALKPHOS 105 08/17/2015 0346   BILITOT 0.7 08/17/2015 0346   GFRNONAA >60 08/20/2015 0510   GFRAA >60 08/20/2015 0510   Assessment:  1. Cirrhosis with hematemesis. Resolved. Suspect either portal gastropathy or low-grade variceal bleeding. 2. Dark stools. Persistent. Suspect from portal gastropathy. Doubt ongoing GI bleeding, but source outside of endoscopy reach can't be definitively excluded. 3. Anemia, stable over 24 hours.  Plan:  1.  Clear liquid diet. 2.  NPO after midnight (except for bowel prep). 3.  Colonoscopy tomorrow for further evaluation; if colonoscopy is unrevealing, patient can likely be discharged tomorrow with follow-up with her primary hepatologist [facility]). 4.  [facility] will follow.   Landry Dyke 08/20/2015, 8:51 AM   Pager [phone] If no answer or after 5 PM call [phone]

## 2015-08-20 NOTE — Progress Notes (Signed)
[facility] PROGRESS NOTE   Stacy Richard [id number] DOB: Feb 07, 1946 DOA: 08/16/2015 PCP: Chesley Noon, MD  HPI/Subjective: Had another dark bowel movement. GI plans for colonoscopy in a.m. noted.  Assessment/Plan: Principal Problem:   Upper GI bleed Active Problems:   Hepatitis C   Type 2 diabetes mellitus (HCC)   HTN (hypertension)   CAD (coronary artery disease)   Hepatic cirrhosis (HCC)    Upper GI bleed Patient has history of cirrhosis, esophageal varices and portal hypertensive gastropathy. Came in with 4 episodes of hematemesis, hemoglobin dropped from 12.0 to 8.0. Admitted to stepdown, GI consulted. EGD done showed small esophageal varices without stigmata of bleeding, patient has mild portal hypertensive gastropathy. No aspirin or NSAIDs, continue nonselective beta blocker target heart rate of 60-70. Continue oral Protonix. Colonoscopy in a.m.  Anemia Anemia of acute blood loss secondary to upper GI bleed. Hemoglobin dropped from 12.0 down to 8.0, will transfuse if hemoglobin drops less than 7.5. Check hemoglobin twice a day. Hemoglobin stable at 8.4.  Type 2 diabetes mellitus, controlled Hemoglobin A1c of 6.4, continue SSI.  Hepatic cirrhosis Hepatic cirrhosis and hepatitis C, compensated, not on treatment for hepatitis C. Also has hypoalbuminemia and thrombocytopenia. Started on Inderal, heart rate ranges between 55 and 65 bpm.  Hypertension Blood pressure on the low side, and tinea hold antihypertensives.  Hypokalemia Repleted with oral supplements.   Code Status: Full Code Family Communication: Plan discussed with the patient. Disposition Plan: Transfer to telemetry Diet: Diet clear liquid Room service appropriate?: Yes; Fluid consistency:: Thin  Consultants:  GI  Procedures:  EGD  Antibiotics:  None   Objective: Filed Vitals:   08/19/15 2034 08/20/15 0522  BP: 110/49 101/56  Pulse: 56 51  Temp: 98.5 F (36.9 C)  98.4 F (36.9 C)  Resp: 18 18    Intake/Output Summary (Last 24 hours) at 08/20/15 0942 Last data filed at 08/19/15 1800  Gross per 24 hour  Intake    960 ml  Output      0 ml  Net    960 ml   Filed Weights   08/16/15 2003 08/17/15 0932  Weight: 79.9 kg (176 lb 2.4 oz) 78.019 kg (172 lb)    Exam: General: Alert and awake, oriented x3, not in any acute distress. HEENT: anicteric sclera, pupils reactive to light and accommodation, EOMI CVS: S1-S2 clear, no murmur rubs or gallops Chest: clear to auscultation bilaterally, no wheezing, rales or rhonchi Abdomen: soft nontender, nondistended, normal bowel sounds, no organomegaly Extremities: no cyanosis, clubbing or edema noted bilaterally Neuro: Cranial nerves II-XII intact, no focal neurological deficits  Data Reviewed: Basic Metabolic Panel:  Recent Labs Lab 08/16/15 1554 08/16/15 1607 08/17/15 0346 08/18/15 0500 08/19/15 0448 08/20/15 0510  NA 136 140 137 140 133* 139  K 4.1 4.1 3.2* 3.7 3.8 3.9  CL 100* 100* 108 112* 106 107  CO2 23  --  26 25 26 28   GLUCOSE 218* 216* 128* 123* 125* 118*  BUN 26* 32* 17 9 7 7   CREATININE 1.02* 0.80 0.61 0.74 0.78 0.71  CALCIUM 8.7*  --  7.2* 7.6* 7.6* 8.1*   Liver Function Tests:  Recent Labs Lab 08/16/15 1554 08/17/15 0346  AST 118* 79*  ALT 80* 57*  ALKPHOS 210* 105  BILITOT 1.2 0.7  PROT 8.2* 5.9*  ALBUMIN 3.1* 2.2*   No results for input(s): LIPASE, AMYLASE in the last 168 hours. No results for input(s): AMMONIA in the last 168 hours. CBC:  Recent Labs  Lab 08/16/15 1554  08/17/15 1718 08/18/15 0500 08/18/15 1636 08/19/15 0448 08/20/15 0510  WBC 11.8*  < > 5.5 5.7 7.2 5.3 5.4  NEUTROABS 7.4  --   --   --   --   --   --   HGB 12.0  < > 8.5* 8.6* 9.6* 8.3* 8.4*  HCT 36.3  < > 25.7* 25.0* 29.1* 24.9* 24.6*  MCV 95.8  < > 96.3 95.4 97.7 95.8 95.7  PLT 154  < > 91* 84* 113* 87* 90*  < > = values in this interval not displayed. Cardiac Enzymes: No results for  input(s): CKTOTAL, CKMB, CKMBINDEX, TROPONINI in the last 168 hours. BNP (last 3 results) No results for input(s): BNP in the last 8760 hours.  ProBNP (last 3 results) No results for input(s): PROBNP in the last 8760 hours.  CBG:  Recent Labs Lab 08/18/15 1717 08/19/15 0745 08/19/15 1135 08/19/15 1641 08/20/15 0721  GLUCAP 82 122* 189* 172* 108*    Micro Recent Results (from the past 240 hour(s))  MRSA PCR Screening     Status: None   Collection Time: 08/16/15  8:10 PM  Result Value Ref Range Status   MRSA by PCR NEGATIVE NEGATIVE Final    Comment:        The GeneXpert MRSA Assay (FDA approved for NASAL specimens only), is one component of a comprehensive MRSA colonization surveillance program. It is not intended to diagnose MRSA infection nor to guide or monitor treatment for MRSA infections.      Studies: No results found.  Scheduled Meds: . busPIRone  7.5 mg Oral Daily  . DULoxetine  60 mg Oral Daily  . insulin aspart  0-9 Units Subcutaneous TID WC  . lactulose  10 g Oral BID  . oxybutynin  5 mg Oral Daily  . pantoprazole  40 mg Oral Daily  . propranolol ER  60 mg Oral Daily   Continuous Infusions:       Time spent: 35 minutes    [facility]  [facility] Pager [phone] If 7PM-7AM, please contact night-coverage at [email or web address], password [facility] 08/20/2015, 9:42 AM  LOS: 4 days

## 2015-08-21 ENCOUNTER — Encounter (HOSPITAL_COMMUNITY): Payer: Self-pay | Admitting: *Deleted

## 2015-08-21 ENCOUNTER — Inpatient Hospital Stay (HOSPITAL_COMMUNITY): Payer: Medicare HMO | Admitting: Registered Nurse

## 2015-08-21 ENCOUNTER — Encounter (HOSPITAL_COMMUNITY)
Admission: EM | Disposition: A | Discharge status: Home / Self Care | Payer: Self-pay | Source: Home / Self Care | Attending: Internal Medicine

## 2015-08-21 HISTORY — PX: COLONOSCOPY WITH PROPOFOL: SHX5780

## 2015-08-21 LAB — BASIC METABOLIC PANEL
Anion gap: 7 (ref 5–15)
BUN: 7 mg/dL (ref 6–20)
CHLORIDE: 102 mmol/L (ref 101–111)
CO2: 25 mmol/L (ref 22–32)
CREATININE: 0.88 mg/dL (ref 0.44–1.00)
Calcium: 8 mg/dL — ABNORMAL LOW (ref 8.9–10.3)
GFR calc Af Amer: >60 mL/min (ref >60)
GFR calc non Af Amer: >60 mL/min (ref >60)
Glucose, Bld: 120 mg/dL — ABNORMAL HIGH (ref 65–99)
Potassium: 3.8 mmol/L (ref 3.5–5.1)
Sodium: 134 mmol/L — ABNORMAL LOW (ref 135–145)

## 2015-08-21 LAB — GLUCOSE, CAPILLARY
Glucose-Capillary: 99 mg/dL (ref 65–99)
Glucose-Capillary: 160 mg/dL — ABNORMAL HIGH (ref 65–99)

## 2015-08-21 LAB — CBC
HEMATOCRIT: 27.9 % — AB (ref 36.0–46.0)
Hemoglobin: 9.1 g/dL — ABNORMAL LOW (ref 12.0–15.0)
MCH: 31.6 pg (ref 26.0–34.0)
MCHC: 32.6 g/dL (ref 30.0–36.0)
MCV: 96.9 fL (ref 78.0–100.0)
PLATELETS: 135 10*3/uL — AB (ref 150–400)
RBC: 2.88 MIL/uL — ABNORMAL LOW (ref 3.87–5.11)
RDW: 15.2 % (ref 11.5–15.5)
WBC: 8.2 10*3/uL (ref 4.0–10.5)

## 2015-08-21 SURGERY — COLONOSCOPY WITH PROPOFOL
Anesthesia: Monitor Anesthesia Care | Laterality: Left

## 2015-08-21 MED ORDER — SODIUM CHLORIDE 0.9 % IV SOLN: INTRAVENOUS | Status: DC

## 2015-08-21 MED ORDER — PROPOFOL 10 MG/ML IV BOLUS
INTRAVENOUS | Status: AC
  Filled 2015-08-21: qty 40

## 2015-08-21 MED ORDER — LACTATED RINGERS IV SOLN
INTRAVENOUS | Status: DC
  Administered 2015-08-21: 1000 mL via INTRAVENOUS
  Administered 2015-08-21: 07:00:00 via INTRAVENOUS

## 2015-08-21 MED ORDER — LIDOCAINE HCL (CARDIAC) 20 MG/ML IV SOLN
INTRAVENOUS | Status: AC
  Filled 2015-08-21: qty 5

## 2015-08-21 MED ORDER — LIDOCAINE HCL (CARDIAC) 20 MG/ML IV SOLN
INTRAVENOUS | Status: DC | PRN
  Administered 2015-08-21: 50 mg via INTRAVENOUS

## 2015-08-21 MED ORDER — PROPOFOL 500 MG/50ML IV EMUL
INTRAVENOUS | Status: DC | PRN
  Administered 2015-08-21: 200 ug/kg/min via INTRAVENOUS

## 2015-08-21 SURGICAL SUPPLY — 21 items

## 2015-08-21 NOTE — Op Note (Signed)
[facility] [place], [address]   COLONOSCOPY PROCEDURE REPORT     EXAM DATE: August 26, 2015  PATIENT NAME:      Stacy Richard, Stacy Richard           MR #:      [id number] BIRTHDATE:       05-Jul-1946      VISIT #:     [id number]  ATTENDING:     Teena Irani, MD     STATUS:     outpatient ASSISTANT:      Elspeth Cho and Sheppard Plumber, [place]  INDICATIONS:  The patient is a 69 yr old female here for a colonoscopy due to anemia and heme positive stool PROCEDURE PERFORMED:     colonoscopy MEDICATIONS:     MAC ESTIMATED BLOOD LOSS:     None  CONSENT: The patient understands the risks and benefits of the procedure and understands that these risks include, but are not limited to: sedation, allergic reaction, infection, perforation and/or bleeding. Alternative means of evaluation and treatment include, among others: physical exam, x-rays, and/or surgical intervention. The patient elects to proceed with this endoscopic procedure.  DESCRIPTION OF PROCEDURE: During intra-op preparation period all mechanical & medical equipment was checked for proper function. Hand hygiene and appropriate measures for infection prevention was taken. After the risks, benefits and alternatives of the procedure were thoroughly explained, Informed consent was verified, confirmed and timeout was successfully executed by the treatment team. A digital exam    The Pentax Ped Colon [id number] endoscope was introduced through the anus and advanced to the cecum     . the prep quality was excellent.      The instrument was then slowly withdrawn as the colon was fully examined.Estimated blood loss is zero unless otherwise noted in this procedure report. the entire examined colon was normal      etroflexed view was unremarkable.      The scope was then completely withdrawn from the patient and the procedure terminated. SCOPE WITHDRAWAL TIME:    ADVERSE EVENTS:      There were no immediate  complications.  IMPRESSIONS:     normal colonoscopy  RECOMMENDATIONS:     epeat study in 10 years RECALL:  _____________________________ Teena Irani, MD eSigned:  Teena Irani, MD Aug 26, 2015 8:18 AM   cc:   CPT CODES: ICD CODES:  The ICD and CPT codes recommended by this software are interpretations from the data that the clinical staff has captured with the software.  The verification of the translation of this report to the ICD and CPT codes and modifiers is the sole responsibility of the health care institution and practicing physician where this report was generated.  [employer] will not be held responsible for the validity of the ICD and CPT codes included on this report.  AMA assumes no liability for data contained or not contained herein. CPT is a Designer, television/film set of the [employer].

## 2015-08-21 NOTE — Transfer of Care (Signed)
Immediate Anesthesia Transfer of Care Note  Patient: Stacy Richard  Procedure(s) Performed: Procedure(s): COLONOSCOPY WITH PROPOFOL (Left)  Patient Location: PACU  Anesthesia Type:MAC  Level of Consciousness: awake, alert , oriented and patient cooperative  Airway & Oxygen Therapy: Patient Spontanous Breathing and Patient connected to face mask oxygen  Post-op Assessment: Report given to RN, Post -op Vital signs reviewed and stable and Patient moving all extremities X 4  Post vital signs: stable  Last Vitals:  Filed Vitals:   08/21/15 0642 08/21/15 0814  BP: 100/50 79/25  Pulse: 54 57  Temp: 36.7 C 36.4 C  Resp: 18 18    Complications: No apparent anesthesia complications

## 2015-08-21 NOTE — Progress Notes (Signed)
[facility] Progress Note  Subjective: New complaints  Objective: Vital signs in last 24 hours: Temp:  [97.5 F (36.4 C)-98.3 F (36.8 C)] 98 F (36.7 C) (12/12 0642) Pulse Rate:  [49-62] 54 (12/12 0642) Resp:  [18] 18 (12/12 0642) BP: (97-109)/(43-58) 100/50 mmHg (12/12 0642) SpO2:  [98 %-100 %] 98 % (12/12 0642) Weight:  [78.019 kg (172 lb)] 78.019 kg (172 lb) (12/12 [id number]) Weight change:    PE: Unchanged  Lab Results: Results for orders placed or performed during the hospital encounter of 08/16/15 (from the past 24 hour(s))  Glucose, capillary     Status: Abnormal   Collection Time: 08/20/15 11:45 AM  Result Value Ref Range   Glucose-Capillary 128 (H) 65 - 99 mg/dL  Glucose, capillary     Status: Abnormal   Collection Time: 08/20/15  4:43 PM  Result Value Ref Range   Glucose-Capillary 132 (H) 65 - 99 mg/dL  Basic metabolic panel     Status: Abnormal   Collection Time: 08/21/15  4:35 AM  Result Value Ref Range   Sodium 134 (L) 135 - 145 mmol/L   Potassium 3.8 3.5 - 5.1 mmol/L   Chloride 102 101 - 111 mmol/L   CO2 25 22 - 32 mmol/L   Glucose, Bld 120 (H) 65 - 99 mg/dL   BUN 7 6 - 20 mg/dL   Creatinine, Ser 0.88 0.44 - 1.00 mg/dL   Calcium 8.0 (L) 8.9 - 10.3 mg/dL   GFR calc non Af Amer >60 >60 mL/min   GFR calc Af Amer >60 >60 mL/min   Anion gap 7 5 - 15  CBC     Status: Abnormal   Collection Time: 08/21/15  4:35 AM  Result Value Ref Range   WBC 8.2 4.0 - 10.5 K/uL   RBC 2.88 (L) 3.87 - 5.11 MIL/uL   Hemoglobin 9.1 (L) 12.0 - 15.0 g/dL   HCT 27.9 (L) 36.0 - 46.0 %   MCV 96.9 78.0 - 100.0 fL   MCH 31.6 26.0 - 34.0 pg   MCHC 32.6 30.0 - 36.0 g/dL   RDW 15.2 11.5 - 15.5 %   Platelets 135 (L) 150 - 400 K/uL    Studies/Results: No results found. Colonoscopy: Completely normal to cecum.   Assessment: GI bleeding, presumed upper source secondary to portal gastropathy, colonoscopy negative.  Plan: Advance diet, okay to discharge from GI  standpoint. Would recommend follow-up with Dr. Paulita Fujita in 3 or 4 weeks.    Willetta York C 08/21/2015, 8:10 AM  Pager [phone] If no answer or after 5 PM call [phone]

## 2015-08-21 NOTE — Addendum Note (Signed)
Addendum  created 08/21/15 1435 by Lissa Morales, CRNA   Modules edited: Anesthesia Attestations

## 2015-08-21 NOTE — Progress Notes (Signed)
Went over discharge summary and medications with patient.  Explained importance of taking medications as prescribed and to go to follow up appointments.  Waiting for daughter to come pick up patient.

## 2015-08-21 NOTE — Care Management Important Message (Signed)
Important Message  Patient Details  Name: Stacy Richard MRN: [id number] Date of Birth: 06-Feb-1946   Medicare Important Message Given:  Yes    Camillo Flaming 08/21/2015, 12:33 [place] Message  Patient Details  Name: Stacy Richard MRN: [id number] Date of Birth: 07-Nov-1945   Medicare Important Message Given:  Yes    Camillo Flaming 08/21/2015, 12:33 PM

## 2015-08-21 NOTE — Discharge Summary (Addendum)
Physician Discharge Summary  Stacy Richard [id number] DOB: Aug 02, 1946 DOA: 08/16/2015  PCP: Chesley Noon, MD  Admit date: 08/16/2015 Discharge date: 08/21/2015  Time spent: 40 minutes  Recommendations for Outpatient Follow-up:  1. Follow-up with primary care physician within one week. 2. Follow-up with GI as outpatient. 3. Lisinopril and Dyazide both discontinued because of normal blood pressure. 4. Aspirin discontinued.   Discharge Diagnoses:  Principal Problem:   Upper GI bleed Active Problems:   Hepatitis C   Type 2 diabetes mellitus (HCC)   HTN (hypertension)   CAD (coronary artery disease)   Hepatic cirrhosis ([place])   Discharge Condition: Stable  Diet recommendation: Heart healthy  Filed Weights   08/16/15 2003 08/17/15 0932 08/21/15 0642  Weight: 79.9 kg (176 lb 2.4 oz) 78.019 kg (172 lb) 78.019 kg (172 lb)    History of present illness:  Stacy Richard is a 69 y.o. female with a history of Hepatic Cirrhosis, Hep C, NIDDM, HTN who presents to the ED with complaints of hematemesis x 4 episodes during the day while at her doctors appointment. She was sent from the Office to the ED. She reports having indigestion like symptoms last night, but denies having ABD pain , denies passing melena. She was evaluated in the ED and was found to have an initial hemoglobin level of 12.0. Gastroenterology on Call Dr Kalman Shan was consulted and is to see in the AM, and patient was started on and IV Octreotide drip and and IV Protonix Drip and referred for admission.   Hospital Course:   Upper GI bleed Patient has history of cirrhosis, esophageal varices and portal hypertensive gastropathy. Came in with 4 episodes of hematemesis, hemoglobin dropped from 12.0 to 8.0. Admitted to stepdown, GI consulted. EGD done showed small esophageal varices without stigmata of bleeding, patient has mild portal hypertensive gastropathy. No aspirin or NSAIDs, continue nonselective beta  blocker target heart rate of 60-70. Patient did have melanotic stools so colonoscopy done on the day of discharge 12/12 and showed normal colon. Patient discharged on daily Protonix. Propranolol is indicated, unable to prescribe because of bradycardia (heart rate mid 50s to mid 60s).  Anemia Anemia of acute blood loss secondary to upper GI bleed. Hemoglobin dropped from 12.0 down to 8.0, will transfuse if hemoglobin drops less than 7.5. Check hemoglobin twice a day. Hemoglobin stable at 8.4.  Type 2 diabetes mellitus, controlled Hemoglobin A1c of 6.4, continue SSI.  Hepatic cirrhosis Hepatic cirrhosis and hepatitis C, compensated, not on treatment for hepatitis C. Also has hypoalbuminemia and thrombocytopenia. Started on Inderal but discontinued secondary to bradycardia, heart rate ranges between 55 and 65 bpm. Can consider low dose of Lasix/Aldactone as outpatient if blood pressure stable.  Hypertension Blood pressure on the low side, and tinea hold antihypertensives. Antihypertensives held in the hospital and discontinued on discharge, blood pressure been in the low side, discharge BP is 106/48.  Hypokalemia Repleted with oral supplements.   Procedures:  EGD done by Dr. Watt Climes on 08/17/2015 1. Small distal esophageal varices without at risk stigmata 2. Small hiatal hernia  3. Mild proximal portal gastropathy  4. Otherwise within normal limits EGD to the second portion of the duodenum without any signs of active bleeding  Colonoscopy done by Dr. Amedeo Plenty on 08/21/2059 1. Normal colonoscopy  Consultations:  GI  Discharge Exam: Filed Vitals:   08/21/15 0840 08/21/15 0856  BP: 92/39 106/48  Pulse:  52  Temp:  97.7 F (36.5 C)  Resp:  18   General: Alert  and awake, oriented x3, not in any acute distress. HEENT: anicteric sclera, pupils reactive to light and accommodation, EOMI CVS: S1-S2 clear, no murmur rubs or gallops Chest: clear to auscultation bilaterally, no  wheezing, rales or rhonchi Abdomen: soft nontender, nondistended, normal bowel sounds, no organomegaly Extremities: no cyanosis, clubbing or edema noted bilaterally Neuro: Cranial nerves II-XII intact, no focal neurological deficits  Discharge Instructions   Discharge Instructions    Diet - low sodium heart healthy    Complete by:  As directed      Increase activity slowly    Complete by:  As directed           Current Discharge Medication List    CONTINUE these medications which have NOT CHANGED   Details  Artificial Tear Ointment (ARTIFICIAL TEARS) ointment Place 1 application into both eyes as needed.     busPIRone (BUSPAR) 7.5 MG tablet Take 1 tablet (7.5 mg total) by mouth 2 (two) times daily as needed. Qty: 60 tablet, Refills: 1    Calcium Carb-Cholecalciferol (CALTRATE 600+D) 600-800 MG-UNIT TABS Take 1 tablet by mouth 2 (two) times daily.    calcium carbonate (TUMS - DOSED IN MG ELEMENTAL CALCIUM) 500 MG chewable tablet Chew 2 tablets by mouth 3 (three) times daily as needed for indigestion or heartburn.    DULoxetine (CYMBALTA) 60 MG capsule Take 60 mg by mouth daily.      erythromycin ophthalmic ointment Place 1 application into both eyes at bedtime.    lactulose (CHRONULAC) 10 GM/15ML solution Take 10 g by mouth 2 (two) times daily.    loratadine (CLARITIN) 10 MG tablet Take 10 mg by mouth daily.    Multiple Vitamin (MULTIVITAMIN WITH MINERALS) TABS tablet Take 1 tablet by mouth daily. Multivitamin with lutein and lycopene    Omega-3 Fatty Acids (FISH OIL) 1000 MG CAPS Take 1 capsule by mouth daily.    oxybutynin (DITROPAN-XL) 5 MG 24 hr tablet Take 5 mg by mouth daily.    Polyethyl Glycol-Propyl Glycol (SYSTANE OP) Apply 1 drop to eye daily as needed (dry eye).     pantoprazole (PROTONIX) 40 MG tablet Take 1 tablet (40 mg total) by mouth daily. NEEDS APPOINTMENT FOR FUTURE REFILLS Qty: 30 tablet, Refills: 0      STOP taking these medications     aspirin  81 MG tablet      triamterene-hydrochlorothiazide (DYAZIDE) 37.5-25 MG per capsule      lisinopril (PRINIVIL,ZESTRIL) 40 MG tablet        No Known Allergies Follow-up Information    Follow up with BADGER,MICHAEL C, MD In 1 week.   Specialty:  Family Medicine   Why:  Appointment time:  Monday, August 28, 2015 at 4:00 pm with Dr. Romeo Apple information:   [place] [id number] [phone]        The results of significant diagnostics from this hospitalization (including imaging, microbiology, ancillary and laboratory) are listed below for reference.    Significant Diagnostic Studies: No results found.  Microbiology: Recent Results (from the past 240 hour(s))  MRSA PCR Screening     Status: None   Collection Time: 08/16/15  8:10 PM  Result Value Ref Range Status   MRSA by PCR NEGATIVE NEGATIVE Final    Comment:        The GeneXpert MRSA Assay (FDA approved for NASAL specimens only), is one component of a comprehensive MRSA colonization surveillance program. It is not intended to diagnose MRSA infection  nor to guide or monitor treatment for MRSA infections.      Labs: Basic Metabolic Panel:  Recent Labs Lab 08/17/15 0346 08/18/15 0500 08/19/15 0448 08/20/15 0510 08/21/15 0435  NA 137 140 133* 139 134*  K 3.2* 3.7 3.8 3.9 3.8  CL 108 112* 106 107 102  CO2 26 25 26 28 25   GLUCOSE 128* 123* 125* 118* 120*  BUN 17 9 7 7 7   CREATININE 0.61 0.74 0.78 0.71 0.88  CALCIUM 7.2* 7.6* 7.6* 8.1* 8.0*   Liver Function Tests:  Recent Labs Lab 08/16/15 1554 08/17/15 0346  AST 118* 79*  ALT 80* 57*  ALKPHOS 210* 105  BILITOT 1.2 0.7  PROT 8.2* 5.9*  ALBUMIN 3.1* 2.2*   No results for input(s): LIPASE, AMYLASE in the last 168 hours. No results for input(s): AMMONIA in the last 168 hours. CBC:  Recent Labs Lab 08/16/15 1554  08/18/15 0500 08/18/15 1636 08/19/15 0448 08/20/15 0510 08/21/15 0435  WBC 11.8*  < > 5.7 7.2 5.3  5.4 8.2  NEUTROABS 7.4  --   --   --   --   --   --   HGB 12.0  < > 8.6* 9.6* 8.3* 8.4* 9.1*  HCT 36.3  < > 25.0* 29.1* 24.9* 24.6* 27.9*  MCV 95.8  < > 95.4 97.7 95.8 95.7 96.9  PLT 154  < > 84* 113* 87* 90* 135*  < > = values in this interval not displayed. Cardiac Enzymes: No results for input(s): CKTOTAL, CKMB, CKMBINDEX, TROPONINI in the last 168 hours. BNP: BNP (last 3 results) No results for input(s): BNP in the last 8760 hours.  ProBNP (last 3 results) No results for input(s): PROBNP in the last 8760 hours.  CBG:  Recent Labs Lab 08/20/15 0721 08/20/15 1145 08/20/15 1643 08/21/15 0915 08/21/15 1201  GLUCAP 108* 128* 132* 99 160*       Signed:  Marianna Cid A  [facility] 08/21/2015, 1:38 PM

## 2015-08-21 NOTE — Anesthesia Postprocedure Evaluation (Signed)
Anesthesia Post Note  Patient: Stacy Richard  Procedure(s) Performed: Procedure(s) (LRB): COLONOSCOPY WITH PROPOFOL (Left)  Patient location during evaluation: PACU Anesthesia Type: MAC Level of consciousness: awake and alert Pain management: pain level controlled Vital Signs Assessment: post-procedure vital signs reviewed and stable Respiratory status: spontaneous breathing, nonlabored ventilation, respiratory function stable and patient connected to nasal cannula oxygen Cardiovascular status: blood pressure returned to baseline and stable Postop Assessment: no signs of nausea or vomiting Anesthetic complications: no    Last Vitals:  Filed Vitals:   08/21/15 0840 08/21/15 0856  BP: 92/39 106/48  Pulse:  52  Temp:  36.5 C  Resp:  18    Last Pain:  Filed Vitals:   08/21/15 0856  PainSc: Asleep                 Donivan Thammavong L

## 2015-08-21 NOTE — Anesthesia Preprocedure Evaluation (Signed)
Anesthesia Evaluation  Patient identified by MRN, date of birth, ID band Patient awake    Reviewed: Allergy & Precautions, H&P , NPO status , Patient's Chart, lab work & pertinent test results  Airway Mallampati: II  TM Distance: >3 FB Neck ROM: full    Dental  (+) Dental Advisory Given, Edentulous Upper, Missing, Loose, Poor Dentition  2 loose lower teeth.  Many missing lower teeth:   Pulmonary neg pulmonary ROS, former smoker,    Pulmonary exam normal breath sounds clear to auscultation       Cardiovascular hypertension, Pt. on medications + CAD  Normal cardiovascular exam Rhythm:regular Rate:Normal     Neuro/Psych negative neurological ROS  negative psych ROS   GI/Hepatic negative GI ROS, GERD  Medicated and Controlled,(+) Hepatitis -, C  Endo/Other  diabetes, Well Controlled, Type 2  Renal/GU negative Renal ROS  negative genitourinary   Musculoskeletal   Abdominal   Peds  Hematology negative hematology ROS (+)   Anesthesia Other Findings   Reproductive/Obstetrics negative OB ROS                             Anesthesia Physical Anesthesia Plan  ASA: III  Anesthesia Plan: MAC   Post-op Pain Management:    Induction:   Airway Management Planned:   Additional Equipment:   Intra-op Plan:   Post-operative Plan:   Informed Consent:   Plan Discussed with: Surgeon  Anesthesia Plan Comments:         Anesthesia Quick Evaluation

## 2015-08-22 ENCOUNTER — Encounter (HOSPITAL_COMMUNITY): Payer: Self-pay | Admitting: Gastroenterology

## 2015-08-28 ENCOUNTER — Ambulatory Visit
Admission: RE | Admit: 2015-08-28 | Discharge: 2015-08-28 | Disposition: A | Discharge status: Home / Self Care | Payer: Medicare HMO | Source: Ambulatory Visit | Attending: Nurse Practitioner | Admitting: Nurse Practitioner

## 2015-08-28 DIAGNOSIS — C220 Liver cell carcinoma: Secondary | ICD-10-CM

## 2015-08-29 ENCOUNTER — Other Ambulatory Visit: Payer: Self-pay | Admitting: Nurse Practitioner

## 2015-08-29 DIAGNOSIS — K769 Liver disease, unspecified: Secondary | ICD-10-CM

## 2015-09-05 ENCOUNTER — Encounter (HOSPITAL_COMMUNITY): Payer: Self-pay | Admitting: Psychiatry

## 2015-09-05 ENCOUNTER — Ambulatory Visit (INDEPENDENT_AMBULATORY_CARE_PROVIDER_SITE_OTHER): Payer: Medicare HMO | Admitting: Psychiatry

## 2015-09-05 DIAGNOSIS — F411 Generalized anxiety disorder: Secondary | ICD-10-CM

## 2015-09-05 DIAGNOSIS — F1910 Other psychoactive substance abuse, uncomplicated: Secondary | ICD-10-CM | POA: Diagnosis not present

## 2015-09-05 DIAGNOSIS — F1911 Other psychoactive substance abuse, in remission: Secondary | ICD-10-CM

## 2015-09-05 DIAGNOSIS — F331 Major depressive disorder, recurrent, moderate: Secondary | ICD-10-CM | POA: Diagnosis not present

## 2015-09-05 MED ORDER — DULOXETINE HCL 60 MG PO CPEP: 60.0000 mg | ORAL_CAPSULE | Freq: Every day | ORAL | Status: DC

## 2015-09-05 MED ORDER — BUSPIRONE HCL 7.5 MG PO TABS: 7.5000 mg | ORAL_TABLET | Freq: Every day | ORAL | Status: DC
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
## 2015-09-07 ENCOUNTER — Ambulatory Visit
Admission: RE | Admit: 2015-09-07 | Discharge: 2015-09-07 | Disposition: A | Discharge status: Home / Self Care | Payer: Medicare HMO | Source: Ambulatory Visit | Attending: Nurse Practitioner | Admitting: Nurse Practitioner

## 2015-09-07 DIAGNOSIS — K769 Liver disease, unspecified: Secondary | ICD-10-CM

## 2015-09-07 MED ORDER — GADOXETATE DISODIUM 0.25 MMOL/ML IV SOLN
7.0000 mL | Freq: Once | INTRAVENOUS | Status: AC | PRN
  Administered 2015-09-07: 7 mL via INTRAVENOUS

## 2015-09-13 ENCOUNTER — Ambulatory Visit (HOSPITAL_COMMUNITY): Payer: Medicare HMO | Admitting: Licensed Clinical Social Worker

## 2015-10-30 ENCOUNTER — Ambulatory Visit (HOSPITAL_COMMUNITY): Payer: Medicare HMO | Admitting: Psychiatry

## 2015-12-21 ENCOUNTER — Other Ambulatory Visit: Payer: Self-pay

## 2015-12-21 DIAGNOSIS — Z1231 Encounter for screening mammogram for malignant neoplasm of breast: Secondary | ICD-10-CM

## 2015-12-26 ENCOUNTER — Telehealth: Payer: Self-pay | Admitting: Internal Medicine

## 2015-12-26 NOTE — Telephone Encounter (Signed)
New message   Pt is calling to speak to Rn about clarifying medications that Dr.Hilty do or do not want her to take  Metoprolol 25mg 

## 2015-12-26 NOTE — Telephone Encounter (Signed)
Spoke with patient and she has not been seen since 10/2013 She has not been taking Metoprolol for a while Does have some chest discomfort at times Scheduled appointment with Dr Debara Pickett for Friday

## 2015-12-29 ENCOUNTER — Encounter: Payer: Self-pay | Admitting: Internal Medicine

## 2015-12-29 ENCOUNTER — Ambulatory Visit (INDEPENDENT_AMBULATORY_CARE_PROVIDER_SITE_OTHER): Payer: Medicare Other | Admitting: Internal Medicine

## 2015-12-29 VITALS — BP 108/90 | HR 84 | Ht 59.0 in | Wt 172.0 lb

## 2015-12-29 DIAGNOSIS — K7469 Other cirrhosis of liver: Secondary | ICD-10-CM

## 2015-12-29 DIAGNOSIS — I2510 Atherosclerotic heart disease of native coronary artery without angina pectoris: Secondary | ICD-10-CM | POA: Diagnosis not present

## 2015-12-29 DIAGNOSIS — B182 Chronic viral hepatitis C: Secondary | ICD-10-CM | POA: Diagnosis not present

## 2015-12-29 NOTE — Progress Notes (Signed)
OFFICE NOTE  Chief Complaint:  Routine follow-up  Primary Care Physician: Chesley Noon, MD  HPI:  Stacy Richard is a pleasant 70 year old female previously followed by Dr. Rollene Fare. She is here to establish new cardiac care. She also has a history of hepatitis C who is contemplating treatment given the new medications available. She also has some cirrhosis of the liver and possibly may have a malignancy. She scheduled for a CT and possibly biopsy later this week. She does have a history of hypertension and coronary artery disease. In 2013 she had a CT angiogram which showed a calcium score of 160, with LAD and circumflex calcium that was nonobstructive. She has been taking lisinopril for blood pressure as well as enalapril mistakenly.  She is asymptomatic and denies any chest pain or shortness of breath. Her heart rate is noted to be elevated. She has diabetes as well.  Stacy Richard returns today for follow-up. She was last seen about 2 years ago. In the past she been followed by Dr. Rollene Fare. Reportedly she has a history of hepatitis C and cirrhosis related to that. In fact recently she was told that she may need liver transplant. She has a history of hypertension and some mild coronary disease. In 2013 a CT angiogram showed an intermediate calcium score. She's never had any symptomatic coronary disease. Recently she was taken off of aspirin, lisinopril and metoprolol, possibly for blood pressure and/or worsening liver function. She denies any chest pain or worsening shortness of breath.  PMHx:  Past Medical History  Diagnosis Date  . GERD (gastroesophageal reflux disease)   . Allergy     Rhinitis  . Hepatitis C   . Osteopenia   . NIDDM (non-insulin dependent diabetes mellitus)   . Osteoarthritis of knee     right knee  . Hypertension   . Arthritis     left hip    Past Surgical History  Procedure Laterality Date  . Knee surgery    . Transthoracic echocardiogram  09/2012    EF  55-54%, mild LVH & mild conc hypertrophy, grade 1 diastolic dysfunction; mild MR  . Nm myocar perf wall motion  09/2012    lexiscan myoview; normal study, low risk  . Esophagogastroduodenoscopy (egd) with propofol N/A 08/17/2015    Procedure: ESOPHAGOGASTRODUODENOSCOPY (EGD) WITH PROPOFOL;  Surgeon: Clarene Essex, MD;  Location: [facility];  Service: Endoscopy;  Laterality: N/A;  . Esophageal banding N/A 08/17/2015    Procedure: ESOPHAGEAL BANDING;  Surgeon: Clarene Essex, MD;  Location: [facility];  Service: Endoscopy;  Laterality: N/A;  ? banding  . Colonoscopy with propofol Left 08/21/2015    Procedure: COLONOSCOPY WITH PROPOFOL;  Surgeon: Teena Irani, MD;  Location: [facility];  Service: Endoscopy;  Laterality: Left;    FAMHx:  Family History  Problem Relation Age of Onset  . Heart failure Mother   . Hepatitis Mother   . Kidney cancer Mother   . Heart disease Father   . Heart Problems Sister     murmur  . Diabetes type II Sister   . Heart Problems Daughter     murmur  . Bipolar disorder Daughter     SOCHx:   reports that she quit smoking about 6 years ago. She has never used smokeless tobacco. She reports that she uses illicit drugs (Marijuana). She reports that she does not drink alcohol.  ALLERGIES:  No Known Allergies  ROS: Pertinent items noted in HPI and remainder of comprehensive ROS otherwise negative.  HOME  MEDS: Current Outpatient Prescriptions  Medication Sig Dispense Refill  . Artificial Tear Ointment (ARTIFICIAL TEARS) ointment Place 1 application into both eyes as needed.     . busPIRone (BUSPAR) 7.5 MG tablet Take 1 tablet (7.5 mg total) by mouth daily. 30 tablet 1  . Calcium Carb-Cholecalciferol (CALTRATE 600+D) 600-800 MG-UNIT TABS Take 1 tablet by mouth 2 (two) times daily.    . calcium carbonate (TUMS - DOSED IN MG ELEMENTAL CALCIUM) 500 MG chewable tablet Chew 2 tablets by mouth 3 (three) times daily as needed for indigestion or heartburn.    . DULoxetine  (CYMBALTA) 60 MG capsule Take 1 capsule (60 mg total) by mouth daily. 30 capsule 1  . erythromycin ophthalmic ointment Place 1 application into both eyes at bedtime.    Marland Kitchen lactulose (CHRONULAC) 10 GM/15ML solution Take 10 g by mouth 2 (two) times daily.    Marland Kitchen loratadine (CLARITIN) 10 MG tablet Take 10 mg by mouth daily.    . Multiple Vitamin (MULTIVITAMIN WITH MINERALS) TABS tablet Take 1 tablet by mouth daily. Multivitamin with lutein and lycopene    . Omega-3 Fatty Acids (FISH OIL) 1000 MG CAPS Take 1 capsule by mouth daily.    Marland Kitchen oxybutynin (DITROPAN-XL) 5 MG 24 hr tablet Take 5 mg by mouth daily.    . pantoprazole (PROTONIX) 40 MG tablet Take 1 tablet (40 mg total) by mouth daily. NEEDS APPOINTMENT FOR FUTURE REFILLS 30 tablet 0  . Polyethyl Glycol-Propyl Glycol (SYSTANE OP) Apply 1 drop to eye daily as needed (dry eye).      No current facility-administered medications for this visit.    LABS/IMAGING: No results found for this or any previous visit (from the past 48 hour(s)). No results found.  VITALS: BP 108/90 mmHg  Pulse 84  Ht 4\' 11"  (1.499 m)  Wt 172 lb (78.019 kg)  BMI 34.72 kg/m2  EXAM: General appearance: alert and no distress Neck: no carotid bruit, no JVD and thyroid not enlarged, symmetric, no tenderness/mass/nodules Lungs: clear to auscultation bilaterally Heart: regular rate and rhythm, S1, S2 normal, no murmur, click, rub or gallop Abdomen: soft, non-tender; bowel sounds normal; no masses,  no organomegaly Extremities: extremities normal, atraumatic, no cyanosis or edema Pulses: 2+ and symmetric Skin: Skin color, texture, turgor normal. No rashes or lesions Neurologic: Grossly normal Psych: Mood, affect normal  EKG: Normal sinus rhythm at 84  ASSESSMENT: 1. Mild to moderate nonobstructive coronary disease (elevated coronary calcium score) 2. Hypertension 3. Tachycardia 4. Diabetes type 2 5. Hepatitis C  PLAN: 1.   Stacy Richard would ideally be on either  ACE inhibitor or beta blocker for coronary disease as well as aspirin. Apparently the aspirin was stopped due to GI bleeding. With regards to her coronary disease, she may likely be off of blood pressure medications due to hypotension. Blood pressure today however was 108/90. I do not see room at this point add back additional medication. It certainly would be reasonable to consider restarting aspirin if she's not had any recurrent bleeding problems. She may ultimately need repeat stress testing if she were to undergo liver transplant. Plan to see her back in about 6 months to further discuss cardiovascular workup.  Pixie Casino, MD, [facility] Attending Cardiologist [place] 12/29/2015, 3:28 PM

## 2015-12-29 NOTE — Patient Instructions (Signed)
Your physician wants you to follow-up in: 6 months with Dr. Hilty. You will receive a reminder letter in the mail two months in advance. If you don't receive a letter, please call our office to schedule the follow-up appointment.    

## 2016-01-03 ENCOUNTER — Ambulatory Visit (INDEPENDENT_AMBULATORY_CARE_PROVIDER_SITE_OTHER): Payer: Medicare Other | Admitting: Psychiatry

## 2016-01-03 ENCOUNTER — Encounter (HOSPITAL_COMMUNITY): Payer: Self-pay | Admitting: Psychiatry

## 2016-01-03 VITALS — BP 130/80 | HR 68 | Ht 59.0 in | Wt 170.0 lb

## 2016-01-03 DIAGNOSIS — F331 Major depressive disorder, recurrent, moderate: Secondary | ICD-10-CM

## 2016-01-03 DIAGNOSIS — F411 Generalized anxiety disorder: Secondary | ICD-10-CM

## 2016-01-03 DIAGNOSIS — F1910 Other psychoactive substance abuse, uncomplicated: Secondary | ICD-10-CM

## 2016-01-03 DIAGNOSIS — F1021 Alcohol dependence, in remission: Secondary | ICD-10-CM

## 2016-01-03 DIAGNOSIS — F1911 Other psychoactive substance abuse, in remission: Secondary | ICD-10-CM

## 2016-01-03 MED ORDER — BUSPIRONE HCL 7.5 MG PO TABS: 7.5000 mg | ORAL_TABLET | Freq: Every day | ORAL | Status: AC

## 2016-01-03 MED ORDER — DULOXETINE HCL 60 MG PO CPEP
60.0000 mg | ORAL_CAPSULE | Freq: Every day | ORAL | Status: AC
Start: 2016-01-03 — End: ?
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
## 2016-01-08 ENCOUNTER — Ambulatory Visit
Admission: RE | Admit: 2016-01-08 | Discharge: 2016-01-08 | Disposition: A | Discharge status: Home / Self Care | Payer: Medicare Other | Source: Ambulatory Visit

## 2016-01-08 DIAGNOSIS — Z1231 Encounter for screening mammogram for malignant neoplasm of breast: Secondary | ICD-10-CM

## 2016-01-10 ENCOUNTER — Other Ambulatory Visit (HOSPITAL_COMMUNITY): Payer: Self-pay | Admitting: Psychiatry

## 2016-01-12 NOTE — Telephone Encounter (Signed)
Received medication request for Cymbalta 60mg . Per Dr. De Nurse, medication request is denied. Rx was faxed to [facility] on 01/12/16.  Pt is schedule for a f/u appt on 6/20.

## 2016-01-24 ENCOUNTER — Inpatient Hospital Stay (HOSPITAL_COMMUNITY)
Admission: EM | Admit: 2016-01-24 | Discharge: 2016-01-27 | DRG: 369 | Disposition: A | Discharge status: Home / Self Care | Payer: Medicare Other | Attending: Internal Medicine | Admitting: Internal Medicine

## 2016-01-24 ENCOUNTER — Encounter (HOSPITAL_COMMUNITY): Payer: Self-pay | Admitting: Emergency Medicine

## 2016-01-24 DIAGNOSIS — Z87891 Personal history of nicotine dependence: Secondary | ICD-10-CM

## 2016-01-24 DIAGNOSIS — K922 Gastrointestinal hemorrhage, unspecified: Secondary | ICD-10-CM

## 2016-01-24 DIAGNOSIS — F329 Major depressive disorder, single episode, unspecified: Secondary | ICD-10-CM | POA: Diagnosis present

## 2016-01-24 DIAGNOSIS — M179 Osteoarthritis of knee, unspecified: Secondary | ICD-10-CM | POA: Diagnosis present

## 2016-01-24 DIAGNOSIS — Z833 Family history of diabetes mellitus: Secondary | ICD-10-CM

## 2016-01-24 DIAGNOSIS — M1710 Unilateral primary osteoarthritis, unspecified knee: Secondary | ICD-10-CM | POA: Diagnosis present

## 2016-01-24 DIAGNOSIS — K920 Hematemesis: Secondary | ICD-10-CM

## 2016-01-24 DIAGNOSIS — Z6834 Body mass index (BMI) 34.0-34.9, adult: Secondary | ICD-10-CM

## 2016-01-24 DIAGNOSIS — D696 Thrombocytopenia, unspecified: Secondary | ICD-10-CM | POA: Diagnosis present

## 2016-01-24 DIAGNOSIS — I2510 Atherosclerotic heart disease of native coronary artery without angina pectoris: Secondary | ICD-10-CM | POA: Diagnosis present

## 2016-01-24 DIAGNOSIS — I864 Gastric varices: Secondary | ICD-10-CM | POA: Diagnosis present

## 2016-01-24 DIAGNOSIS — Z8051 Family history of malignant neoplasm of kidney: Secondary | ICD-10-CM

## 2016-01-24 DIAGNOSIS — E119 Type 2 diabetes mellitus without complications: Secondary | ICD-10-CM

## 2016-01-24 DIAGNOSIS — I8501 Esophageal varices with bleeding: Principal | ICD-10-CM

## 2016-01-24 DIAGNOSIS — K219 Gastro-esophageal reflux disease without esophagitis: Secondary | ICD-10-CM | POA: Diagnosis present

## 2016-01-24 DIAGNOSIS — D72829 Elevated white blood cell count, unspecified: Secondary | ICD-10-CM | POA: Diagnosis present

## 2016-01-24 DIAGNOSIS — Z79899 Other long term (current) drug therapy: Secondary | ICD-10-CM

## 2016-01-24 DIAGNOSIS — B1920 Unspecified viral hepatitis C without hepatic coma: Secondary | ICD-10-CM | POA: Diagnosis present

## 2016-01-24 DIAGNOSIS — E669 Obesity, unspecified: Secondary | ICD-10-CM | POA: Diagnosis present

## 2016-01-24 DIAGNOSIS — F419 Anxiety disorder, unspecified: Secondary | ICD-10-CM | POA: Diagnosis present

## 2016-01-24 DIAGNOSIS — I10 Essential (primary) hypertension: Secondary | ICD-10-CM | POA: Diagnosis present

## 2016-01-24 DIAGNOSIS — D638 Anemia in other chronic diseases classified elsewhere: Secondary | ICD-10-CM | POA: Diagnosis present

## 2016-01-24 DIAGNOSIS — D62 Acute posthemorrhagic anemia: Secondary | ICD-10-CM | POA: Insufficient documentation

## 2016-01-24 DIAGNOSIS — K7460 Unspecified cirrhosis of liver: Secondary | ICD-10-CM | POA: Diagnosis present

## 2016-01-24 DIAGNOSIS — M1990 Unspecified osteoarthritis, unspecified site: Secondary | ICD-10-CM | POA: Diagnosis present

## 2016-01-24 DIAGNOSIS — K766 Portal hypertension: Secondary | ICD-10-CM | POA: Diagnosis present

## 2016-01-24 DIAGNOSIS — Z8249 Family history of ischemic heart disease and other diseases of the circulatory system: Secondary | ICD-10-CM

## 2016-01-24 DIAGNOSIS — E876 Hypokalemia: Secondary | ICD-10-CM | POA: Diagnosis present

## 2016-01-24 HISTORY — DX: Unspecified cirrhosis of liver: K74.60

## 2016-01-24 MED ORDER — SODIUM CHLORIDE 0.9 % IV SOLN
80.0000 mg | Freq: Once | INTRAVENOUS | Status: DC
  Filled 2016-01-24: qty 80

## 2016-01-24 MED ORDER — DEXTROSE 5 % IV SOLN
2.0000 g | Freq: Once | INTRAVENOUS | Status: AC
  Administered 2016-01-25: 2 g via INTRAVENOUS
  Filled 2016-01-24: qty 2

## 2016-01-24 MED ORDER — SODIUM CHLORIDE 0.9 % IV BOLUS (SEPSIS)
1000.0000 mL | Freq: Once | INTRAVENOUS | Status: AC
  Administered 2016-01-25: 1000 mL via INTRAVENOUS

## 2016-01-24 NOTE — ED Notes (Signed)
Bed: WA20 Expected date:  Expected time:  Means of arrival:  Comments: EMS 70 yo female hep C/vomiting blood

## 2016-01-24 NOTE — ED Notes (Addendum)
Pt states that she has been vomiting coffee ground emesis since this morning. 4+oz seen by EMS. Alert and oriented. Hx of Hep. C. Hx of same vomiting in December. 20g LAC. 4mg  zofran and fluids given en route.

## 2016-01-24 NOTE — ED Provider Notes (Signed)
CSN: [id number]     Arrival date & time 01/24/16  2319 History  By signing my name below, I, Evelene Croon, attest that this documentation has been prepared under the direction and in the presence of Everlene Balls, MD . Electronically Signed: Evelene Croon, Scribe. 01/25/2016. 12:10 AM.  Chief Complaint  Patient presents with  . Hematemesis    The history is provided by the patient. No language interpreter was used.    HPI Comments:  Stacy Richard is a 70 y.o. female with a history of Hep C, who presents to the Emergency Department complaining of hematemesis which began ~ 2200 this evening. Pt describes bright red blood in her emesis. Pt reports h/o same last year. She denies bright red blood in her stool and melena. She has no abdominal pain or fevers.  No alleviating factors noted. She denies h/o varices. Pt has no other complaints or symptoms at this time.    Past Medical History  Diagnosis Date  . GERD (gastroesophageal reflux disease)   . Allergy     Rhinitis  . Hepatitis C   . Osteopenia   . NIDDM (non-insulin dependent diabetes mellitus)   . Osteoarthritis of knee     right knee  . Hypertension   . Arthritis     left hip   Past Surgical History  Procedure Laterality Date  . Knee surgery    . Transthoracic echocardiogram  09/2012    EF 55-54%, mild LVH & mild conc hypertrophy, grade 1 diastolic dysfunction; mild MR  . Nm myocar perf wall motion  09/2012    lexiscan myoview; normal study, low risk  . Esophagogastroduodenoscopy (egd) with propofol N/A 08/17/2015    Procedure: ESOPHAGOGASTRODUODENOSCOPY (EGD) WITH PROPOFOL;  Surgeon: Clarene Essex, MD;  Location[facility];  Service: Endoscopy;  Laterality: N/A;  . Esophageal banding N/A 08/17/2015    Procedure: ESOPHAGEAL BANDING;  Surgeon: Clarene Essex, MD;  Location: [facility];  Service: Endoscopy;  Laterality: N/A;  ? banding  . Colonoscopy with propofol Left 08/21/2015    Procedure: COLONOSCOPY WITH PROPOFOL;  Surgeon: Teena Irani, MD;  Location: [facility];  Service: Endoscopy;  Laterality: Left;   Family History  Problem Relation Age of Onset  . Heart failure Mother   . Hepatitis Mother   . Kidney cancer Mother   . Heart disease Father   . Heart Problems Sister     murmur  . Diabetes type II Sister   . Heart Problems Daughter     murmur  . Bipolar disorder Daughter    Social History  Substance Use Topics  . Smoking status: Former Smoker    Quit date: 10/28/2009  . Smokeless tobacco: Never Used  . Alcohol Use: No   OB History    No data available     Review of Systems 10 systems reviewed and all are negative for acute change except as noted in the HPI.   Allergies  Review of patient's allergies indicates no known allergies.  Home Medications   Prior to Admission medications   Medication Sig Start Date End Date Taking? Authorizing Provider  Artificial Tear Ointment (ARTIFICIAL TEARS) ointment Place 1 application into both eyes as needed.     Historical Provider, MD  busPIRone (BUSPAR) 7.5 MG tablet Take 1 tablet (7.5 mg total) by mouth daily. 01/03/16   Merian Capron, MD  Calcium Carb-Cholecalciferol (CALTRATE 600+D) 600-800 MG-UNIT TABS Take 1 tablet by mouth 2 (two) times daily.    Historical Provider, MD  calcium carbonate (TUMS - DOSED IN MG ELEMENTAL CALCIUM) 500 MG chewable tablet Chew 2 tablets by mouth 3 (three) times daily as needed for indigestion or heartburn.    Historical Provider, MD  DULoxetine (CYMBALTA) 60 MG capsule Take 1 capsule (60 mg total) by mouth daily. 01/03/16   Merian Capron, MD  erythromycin ophthalmic ointment Place 1 application into both eyes at bedtime.    Historical Provider, MD  lactulose (CHRONULAC) 10 GM/15ML solution Take 10 g by mouth 2 (two) times daily.    Historical Provider, MD  loratadine (CLARITIN) 10 MG tablet Take 10 mg by mouth daily.    Historical Provider, MD  Multiple Vitamin (MULTIVITAMIN WITH MINERALS) TABS tablet Take 1 tablet by mouth  daily. Multivitamin with lutein and lycopene    Historical Provider, MD  oxybutynin (DITROPAN-XL) 5 MG 24 hr tablet Take 5 mg by mouth daily.    Historical Provider, MD  Polyethyl Glycol-Propyl Glycol (SYSTANE OP) Apply 1 drop to eye daily as needed (dry eye).     Historical Provider, MD  ribavirin (REBETOL) 200 MG capsule Take 200 mg by mouth 2 (two) times daily.    Historical Provider, MD   There were no vitals taken for this visit. Physical Exam  Constitutional: She is oriented to person, place, and time. She appears well-developed and well-nourished. No distress.  HENT:  Head: Normocephalic and atraumatic.  Nose: Nose normal.  Mouth/Throat: No oropharyngeal exudate.  Dried blood in oropharynx  Eyes: Conjunctivae and EOM are normal. Pupils are equal, round, and reactive to light. Scleral icterus is present.  Neck: Normal range of motion. Neck supple. No JVD present. No tracheal deviation present. No thyromegaly present.  Cardiovascular: Normal rate, regular rhythm and normal heart sounds.  Exam reveals no gallop and no friction rub.   No murmur heard. Pulmonary/Chest: Effort normal and breath sounds normal. No respiratory distress. She has no wheezes. She exhibits no tenderness.  Abdominal: Soft. Bowel sounds are normal. She exhibits no distension and no mass. There is no tenderness. There is no rebound and no guarding.  Musculoskeletal: Normal range of motion. She exhibits no edema or tenderness.  Lymphadenopathy:    She has no cervical adenopathy.  Neurological: She is alert and oriented to person, place, and time. No cranial nerve deficit. She exhibits normal muscle tone.  Skin: Skin is warm and dry. No rash noted. No erythema. No pallor.  Nursing note and vitals reviewed.   ED Course  Procedures   DIAGNOSTIC STUDIES:  Oxygen Saturation is 100% on room air, normal by my interpretation.    COORDINATION OF CARE:  11:53 PM Discussed treatment plan with pt at bedside and pt  agreed to plan.  Labs Review Labs Reviewed  COMPREHENSIVE METABOLIC PANEL - Abnormal; Notable for the following:    Glucose, Bld 184 (*)    BUN 35 (*)    Creatinine, Ser 1.15 (*)    Total Protein 8.2 (*)    Albumin 3.1 (*)    AST 42 (*)    Total Bilirubin 2.9 (*)    GFR calc non Af Amer 48 (*)    GFR calc Af Amer 55 (*)    All other components within normal limits  PROTIME-INR - Abnormal; Notable for the following:    Prothrombin Time 16.9 (*)    All other components within normal limits  CBC - Abnormal; Notable for the following:    WBC 12.8 (*)    Platelets 138 (*)    All  other components within normal limits  TYPE AND SCREEN    Imaging Review No results found. I have personally reviewed and evaluated these images and lab results as part of my medical decision-making.   EKG Interpretation None      MDM   Final diagnoses:  None   Patient presents to the ED for vomiting blood.  She denies history of varices but her records show that she does have it.  She was given octreotide bolus and will be placed on a drip. Pharmacy states PPI interact with her hep C medication.  Will discuss with hospitalist for treatment.   Also given ceftriaxone. abs are pending. Patient will likely require admission to the hospital for GI bleed.  1:14 AM Hgb is normal at 13. Patient is high risk however with documented history of cirrhosis.  Dr. Porfirio Mylar accepts patient to step down for management.  She is HD stable   I personally performed the services described in this documentation, which was scribed in my presence. The recorded information has been reviewed and is accurate.      Everlene Balls, MD 01/25/16 [phone]

## 2016-01-25 ENCOUNTER — Inpatient Hospital Stay (HOSPITAL_COMMUNITY): Payer: Medicare Other | Admitting: Anesthesiology

## 2016-01-25 ENCOUNTER — Encounter (HOSPITAL_COMMUNITY)
Admission: EM | Disposition: A | Discharge status: Home / Self Care | Payer: Self-pay | Source: Home / Self Care | Attending: Internal Medicine

## 2016-01-25 ENCOUNTER — Other Ambulatory Visit: Payer: Self-pay

## 2016-01-25 ENCOUNTER — Encounter (HOSPITAL_COMMUNITY): Payer: Self-pay | Admitting: Internal Medicine

## 2016-01-25 DIAGNOSIS — K7469 Other cirrhosis of liver: Secondary | ICD-10-CM

## 2016-01-25 DIAGNOSIS — D638 Anemia in other chronic diseases classified elsewhere: Secondary | ICD-10-CM | POA: Diagnosis present

## 2016-01-25 DIAGNOSIS — D72829 Elevated white blood cell count, unspecified: Secondary | ICD-10-CM | POA: Diagnosis present

## 2016-01-25 DIAGNOSIS — K219 Gastro-esophageal reflux disease without esophagitis: Secondary | ICD-10-CM | POA: Diagnosis present

## 2016-01-25 DIAGNOSIS — K7460 Unspecified cirrhosis of liver: Secondary | ICD-10-CM | POA: Diagnosis present

## 2016-01-25 DIAGNOSIS — Z8249 Family history of ischemic heart disease and other diseases of the circulatory system: Secondary | ICD-10-CM | POA: Diagnosis not present

## 2016-01-25 DIAGNOSIS — F329 Major depressive disorder, single episode, unspecified: Secondary | ICD-10-CM | POA: Diagnosis present

## 2016-01-25 DIAGNOSIS — I10 Essential (primary) hypertension: Secondary | ICD-10-CM | POA: Diagnosis present

## 2016-01-25 DIAGNOSIS — F419 Anxiety disorder, unspecified: Secondary | ICD-10-CM | POA: Diagnosis present

## 2016-01-25 DIAGNOSIS — Z833 Family history of diabetes mellitus: Secondary | ICD-10-CM | POA: Diagnosis not present

## 2016-01-25 DIAGNOSIS — E669 Obesity, unspecified: Secondary | ICD-10-CM | POA: Diagnosis present

## 2016-01-25 DIAGNOSIS — D62 Acute posthemorrhagic anemia: Secondary | ICD-10-CM | POA: Diagnosis present

## 2016-01-25 DIAGNOSIS — K920 Hematemesis: Secondary | ICD-10-CM | POA: Diagnosis present

## 2016-01-25 DIAGNOSIS — K922 Gastrointestinal hemorrhage, unspecified: Secondary | ICD-10-CM | POA: Diagnosis present

## 2016-01-25 DIAGNOSIS — D696 Thrombocytopenia, unspecified: Secondary | ICD-10-CM | POA: Diagnosis present

## 2016-01-25 DIAGNOSIS — I2510 Atherosclerotic heart disease of native coronary artery without angina pectoris: Secondary | ICD-10-CM | POA: Diagnosis present

## 2016-01-25 DIAGNOSIS — K766 Portal hypertension: Secondary | ICD-10-CM | POA: Diagnosis present

## 2016-01-25 DIAGNOSIS — M1990 Unspecified osteoarthritis, unspecified site: Secondary | ICD-10-CM | POA: Diagnosis present

## 2016-01-25 DIAGNOSIS — I864 Gastric varices: Secondary | ICD-10-CM | POA: Diagnosis present

## 2016-01-25 DIAGNOSIS — B182 Chronic viral hepatitis C: Secondary | ICD-10-CM

## 2016-01-25 DIAGNOSIS — M179 Osteoarthritis of knee, unspecified: Secondary | ICD-10-CM | POA: Diagnosis present

## 2016-01-25 DIAGNOSIS — Z87891 Personal history of nicotine dependence: Secondary | ICD-10-CM | POA: Diagnosis not present

## 2016-01-25 DIAGNOSIS — E876 Hypokalemia: Secondary | ICD-10-CM | POA: Diagnosis present

## 2016-01-25 DIAGNOSIS — Z6834 Body mass index (BMI) 34.0-34.9, adult: Secondary | ICD-10-CM | POA: Diagnosis not present

## 2016-01-25 DIAGNOSIS — B1920 Unspecified viral hepatitis C without hepatic coma: Secondary | ICD-10-CM | POA: Diagnosis present

## 2016-01-25 DIAGNOSIS — Z8051 Family history of malignant neoplasm of kidney: Secondary | ICD-10-CM | POA: Diagnosis not present

## 2016-01-25 DIAGNOSIS — Z79899 Other long term (current) drug therapy: Secondary | ICD-10-CM | POA: Diagnosis not present

## 2016-01-25 DIAGNOSIS — E119 Type 2 diabetes mellitus without complications: Secondary | ICD-10-CM | POA: Diagnosis present

## 2016-01-25 DIAGNOSIS — I8501 Esophageal varices with bleeding: Secondary | ICD-10-CM | POA: Diagnosis present

## 2016-01-25 HISTORY — PX: ESOPHAGOGASTRODUODENOSCOPY: SHX5428

## 2016-01-25 LAB — COMPREHENSIVE METABOLIC PANEL
ALBUMIN: 2.8 g/dL — AB (ref 3.5–5.0)
ALK PHOS: 106 U/L (ref 38–126)
ALT: 26 U/L (ref 14–54)
ALT: 27 U/L (ref 14–54)
ANION GAP: 9 (ref 5–15)
ANION GAP: 11 (ref 5–15)
AST: 37 U/L (ref 15–41)
AST: 42 U/L — ABNORMAL HIGH (ref 15–41)
Albumin: 3.1 g/dL — ABNORMAL LOW (ref 3.5–5.0)
Alkaline Phosphatase: 117 U/L (ref 38–126)
BUN: 30 mg/dL — ABNORMAL HIGH (ref 6–20)
BUN: 35 mg/dL — ABNORMAL HIGH (ref 6–20)
CALCIUM: 8.3 mg/dL — AB (ref 8.9–10.3)
CHLORIDE: 106 mmol/L (ref 101–111)
CHLORIDE: 102 mmol/L (ref 101–111)
CO2: 24 mmol/L (ref 22–32)
CO2: 27 mmol/L (ref 22–32)
Calcium: 9.5 mg/dL (ref 8.9–10.3)
Creatinine, Ser: 0.93 mg/dL (ref 0.44–1.00)
Creatinine, Ser: 1.15 mg/dL — ABNORMAL HIGH (ref 0.44–1.00)
GFR calc non Af Amer: >60 mL/min (ref >60)
GFR, EST AFRICAN AMERICAN: 55 mL/min — AB (ref >60)
GFR, EST NON AFRICAN AMERICAN: 48 mL/min — AB (ref >60)
Glucose, Bld: 147 mg/dL — ABNORMAL HIGH (ref 65–99)
Glucose, Bld: 184 mg/dL — ABNORMAL HIGH (ref 65–99)
POTASSIUM: 3.8 mmol/L (ref 3.5–5.1)
POTASSIUM: 3.6 mmol/L (ref 3.5–5.1)
SODIUM: 139 mmol/L (ref 135–145)
SODIUM: 140 mmol/L (ref 135–145)
Total Bilirubin: 2.2 mg/dL — ABNORMAL HIGH (ref 0.3–1.2)
Total Bilirubin: 2.9 mg/dL — ABNORMAL HIGH (ref 0.3–1.2)
Total Protein: 7.6 g/dL (ref 6.5–8.1)
Total Protein: 8.2 g/dL — ABNORMAL HIGH (ref 6.5–8.1)

## 2016-01-25 LAB — CBC
HCT: 32.7 % — ABNORMAL LOW (ref 36.0–46.0)
HCT: 32.7 % — ABNORMAL LOW (ref 36.0–46.0)
HCT: 40.4 % (ref 36.0–46.0)
HEMOGLOBIN: 13.1 g/dL (ref 12.0–15.0)
Hemoglobin: 11 g/dL — ABNORMAL LOW (ref 12.0–15.0)
Hemoglobin: 10.8 g/dL — ABNORMAL LOW (ref 12.0–15.0)
MCH: 30 pg (ref 26.0–34.0)
MCH: 30.4 pg (ref 26.0–34.0)
MCH: 30.7 pg (ref 26.0–34.0)
MCHC: 32.4 g/dL (ref 30.0–36.0)
MCHC: 33 g/dL (ref 30.0–36.0)
MCHC: 33.6 g/dL (ref 30.0–36.0)
MCV: 91.3 fL (ref 78.0–100.0)
MCV: 92.1 fL (ref 78.0–100.0)
MCV: 92.4 fL (ref 78.0–100.0)
PLATELETS: 118 10*3/uL — AB (ref 150–400)
Platelets: 105 10*3/uL — ABNORMAL LOW (ref 150–400)
Platelets: 138 10*3/uL — ABNORMAL LOW (ref 150–400)
RBC: 3.55 MIL/uL — ABNORMAL LOW (ref 3.87–5.11)
RBC: 3.58 MIL/uL — ABNORMAL LOW (ref 3.87–5.11)
RBC: 4.37 MIL/uL (ref 3.87–5.11)
RDW: 15.1 % (ref 11.5–15.5)
RDW: 15.1 % (ref 11.5–15.5)
RDW: 15.3 % (ref 11.5–15.5)
WBC: 11.2 10*3/uL — ABNORMAL HIGH (ref 4.0–10.5)
WBC: 12.3 10*3/uL — AB (ref 4.0–10.5)
WBC: 12.8 10*3/uL — ABNORMAL HIGH (ref 4.0–10.5)

## 2016-01-25 LAB — GLUCOSE, CAPILLARY
GLUCOSE-CAPILLARY: 158 mg/dL — AB (ref 65–99)
GLUCOSE-CAPILLARY: 140 mg/dL — AB (ref 65–99)
GLUCOSE-CAPILLARY: 129 mg/dL — AB (ref 65–99)
GLUCOSE-CAPILLARY: 121 mg/dL — AB (ref 65–99)

## 2016-01-25 LAB — PROTIME-INR
INR: 1.37 (ref 0.00–1.49)
PROTHROMBIN TIME: 16.9 s — AB (ref 11.6–15.2)

## 2016-01-25 LAB — BRAIN NATRIURETIC PEPTIDE: B NATRIURETIC PEPTIDE 5: 27.6 pg/mL (ref 0.0–100.0)

## 2016-01-25 LAB — HEMOGLOBIN AND HEMATOCRIT, BLOOD
HEMATOCRIT: 29.2 % — AB (ref 36.0–46.0)
HEMATOCRIT: 25.6 % — AB (ref 36.0–46.0)
HEMOGLOBIN: 8.4 g/dL — AB (ref 12.0–15.0)
Hemoglobin: 9.6 g/dL — ABNORMAL LOW (ref 12.0–15.0)

## 2016-01-25 LAB — APTT: aPTT: 29 seconds (ref 24–37)

## 2016-01-25 LAB — TROPONIN I: Troponin I: <0.03 ng/mL (ref <0.031)

## 2016-01-25 LAB — MRSA PCR SCREENING: MRSA by PCR: NEGATIVE

## 2016-01-25 SURGERY — EGD (ESOPHAGOGASTRODUODENOSCOPY)
Anesthesia: Monitor Anesthesia Care

## 2016-01-25 MED ORDER — CALCIUM CARBONATE ANTACID 500 MG PO CHEW: 2.0000 | CHEWABLE_TABLET | Freq: Three times a day (TID) | ORAL | Status: DC | PRN

## 2016-01-25 MED ORDER — LEDIPASVIR-SOFOSBUVIR 90-400 MG PO TABS
1.0000 | ORAL_TABLET | Freq: Every day | ORAL | Status: DC
  Administered 2016-01-25 – 2016-01-26 (×2): 1 via ORAL
  Filled 2016-01-25: qty 1

## 2016-01-25 MED ORDER — LIDOCAINE HCL (CARDIAC) 20 MG/ML IV SOLN
INTRAVENOUS | Status: AC
  Filled 2016-01-25: qty 5

## 2016-01-25 MED ORDER — CALCIUM CARBONATE-VITAMIN D 500-200 MG-UNIT PO TABS
1.0000 | ORAL_TABLET | Freq: Two times a day (BID) | ORAL | Status: DC
  Administered 2016-01-25 – 2016-01-26 (×4): 1 via ORAL
  Filled 2016-01-25 (×7): qty 1

## 2016-01-25 MED ORDER — RIBAVIRIN 200 MG PO CAPS
600.0000 mg | ORAL_CAPSULE | Freq: Every day | ORAL | Status: DC
  Administered 2016-01-25 – 2016-01-27 (×3): 600 mg via ORAL

## 2016-01-25 MED ORDER — INSULIN ASPART 100 UNIT/ML ~~LOC~~ SOLN: 0.0000 [IU] | Freq: Every day | SUBCUTANEOUS | Status: DC

## 2016-01-25 MED ORDER — PROPOFOL 10 MG/ML IV BOLUS
INTRAVENOUS | Status: DC | PRN
  Administered 2016-01-25 (×3): 20 mg via INTRAVENOUS

## 2016-01-25 MED ORDER — SODIUM CHLORIDE 0.9 % IV BOLUS (SEPSIS)
1000.0000 mL | Freq: Once | INTRAVENOUS | Status: AC
  Administered 2016-01-25: 1000 mL via INTRAVENOUS

## 2016-01-25 MED ORDER — ARTIFICIAL TEARS OP OINT
1.0000 "application " | TOPICAL_OINTMENT | OPHTHALMIC | Status: DC | PRN
  Administered 2016-01-26: 1 via OPHTHALMIC
  Filled 2016-01-25: qty 3.5

## 2016-01-25 MED ORDER — FENTANYL CITRATE (PF) 100 MCG/2ML IJ SOLN: 25.0000 ug | INTRAMUSCULAR | Status: DC | PRN

## 2016-01-25 MED ORDER — OXYBUTYNIN CHLORIDE ER 5 MG PO TB24
5.0000 mg | ORAL_TABLET | Freq: Every day | ORAL | Status: DC
  Administered 2016-01-25 – 2016-01-26 (×2): 5 mg via ORAL
  Filled 2016-01-25 (×3): qty 1

## 2016-01-25 MED ORDER — FAMOTIDINE 40 MG PO TABS
40.0000 mg | ORAL_TABLET | Freq: Two times a day (BID) | ORAL | Status: DC
  Administered 2016-01-25 – 2016-01-26 (×3): 40 mg via ORAL
  Filled 2016-01-25 (×4): qty 1
  Filled 2016-01-25 (×2): qty 2
  Filled 2016-01-25 (×2): qty 1

## 2016-01-25 MED ORDER — METOPROLOL SUCCINATE ER 50 MG PO TB24
50.0000 mg | ORAL_TABLET | Freq: Every day | ORAL | Status: DC
  Administered 2016-01-25: 50 mg via ORAL
  Filled 2016-01-25 (×3): qty 1

## 2016-01-25 MED ORDER — PROPOFOL 10 MG/ML IV BOLUS
INTRAVENOUS | Status: AC
  Filled 2016-01-25: qty 40

## 2016-01-25 MED ORDER — CEFTRIAXONE SODIUM 2 G IJ SOLR
2.0000 g | INTRAMUSCULAR | Status: DC
  Administered 2016-01-25: 2 g via INTRAVENOUS
  Filled 2016-01-25: qty 2

## 2016-01-25 MED ORDER — RIBAVIRIN 200 MG PO CAPS: 200.0000 mg | ORAL_CAPSULE | Freq: Two times a day (BID) | ORAL | Status: DC

## 2016-01-25 MED ORDER — BUSPIRONE HCL 15 MG PO TABS
7.5000 mg | ORAL_TABLET | Freq: Every day | ORAL | Status: DC
  Administered 2016-01-25 – 2016-01-26 (×2): 7.5 mg via ORAL
  Filled 2016-01-25 (×4): qty 1

## 2016-01-25 MED ORDER — LACTULOSE 10 GM/15ML PO SOLN
10.0000 g | Freq: Two times a day (BID) | ORAL | Status: DC
  Administered 2016-01-25 – 2016-01-26 (×5): 10 g via ORAL
  Filled 2016-01-25 (×7): qty 15

## 2016-01-25 MED ORDER — ADULT MULTIVITAMIN W/MINERALS CH
1.0000 | ORAL_TABLET | Freq: Every day | ORAL | Status: DC
  Administered 2016-01-26: 1 via ORAL
  Filled 2016-01-25 (×3): qty 1

## 2016-01-25 MED ORDER — ONDANSETRON HCL 4 MG/2ML IJ SOLN: 4.0000 mg | Freq: Once | INTRAMUSCULAR | Status: DC | PRN

## 2016-01-25 MED ORDER — SODIUM CHLORIDE 0.9 % IV SOLN
8.0000 mg/h | INTRAVENOUS | Status: DC
  Filled 2016-01-25: qty 80

## 2016-01-25 MED ORDER — SODIUM CHLORIDE 0.9 % IV SOLN
INTRAVENOUS | Status: DC
  Administered 2016-01-25: 03:00:00 via INTRAVENOUS

## 2016-01-25 MED ORDER — INSULIN ASPART 100 UNIT/ML ~~LOC~~ SOLN
0.0000 [IU] | Freq: Three times a day (TID) | SUBCUTANEOUS | Status: DC
  Administered 2016-01-25 (×2): 1 [IU] via SUBCUTANEOUS
  Administered 2016-01-25: 2 [IU] via SUBCUTANEOUS
  Administered 2016-01-26 (×2): 1 [IU] via SUBCUTANEOUS
  Administered 2016-01-26: 2 [IU] via SUBCUTANEOUS
  Administered 2016-01-27: 1 [IU] via SUBCUTANEOUS

## 2016-01-25 MED ORDER — ONDANSETRON HCL 4 MG/2ML IJ SOLN: 4.0000 mg | Freq: Three times a day (TID) | INTRAMUSCULAR | Status: DC | PRN

## 2016-01-25 MED ORDER — FAMOTIDINE IN NACL 20-0.9 MG/50ML-% IV SOLN
20.0000 mg | Freq: Two times a day (BID) | INTRAVENOUS | Status: DC
  Administered 2016-01-25 (×2): 20 mg via INTRAVENOUS
  Filled 2016-01-25 (×2): qty 50

## 2016-01-25 MED ORDER — CETYLPYRIDINIUM CHLORIDE 0.05 % MT LIQD
7.0000 mL | Freq: Two times a day (BID) | OROMUCOSAL | Status: DC
  Administered 2016-01-25 – 2016-01-26 (×4): 7 mL via OROMUCOSAL

## 2016-01-25 MED ORDER — DULOXETINE HCL 60 MG PO CPEP
60.0000 mg | ORAL_CAPSULE | Freq: Every day | ORAL | Status: DC
  Administered 2016-01-25 – 2016-01-26 (×2): 60 mg via ORAL
  Filled 2016-01-25: qty 2
  Filled 2016-01-25 (×2): qty 1
  Filled 2016-01-25: qty 2

## 2016-01-25 MED ORDER — LACTATED RINGERS IV SOLN
INTRAVENOUS | Status: DC
  Administered 2016-01-25: 1000 mL via INTRAVENOUS

## 2016-01-25 MED ORDER — ERYTHROMYCIN 5 MG/GM OP OINT
1.0000 "application " | TOPICAL_OINTMENT | Freq: Every day | OPHTHALMIC | Status: DC
  Administered 2016-01-25 – 2016-01-26 (×3): 1 via OPHTHALMIC
  Filled 2016-01-25: qty 3.5

## 2016-01-25 MED ORDER — SODIUM CHLORIDE 0.9 % IV SOLN
50.0000 ug/h | INTRAVENOUS | Status: DC
  Administered 2016-01-25 (×2): 50 ug/h via INTRAVENOUS
  Filled 2016-01-25 (×5): qty 1

## 2016-01-25 MED ORDER — PROPOFOL 500 MG/50ML IV EMUL
INTRAVENOUS | Status: DC | PRN
  Administered 2016-01-25: 120 ug/kg/min via INTRAVENOUS

## 2016-01-25 MED ORDER — LORATADINE 10 MG PO TABS
10.0000 mg | ORAL_TABLET | Freq: Every day | ORAL | Status: DC
  Administered 2016-01-25 – 2016-01-26 (×2): 10 mg via ORAL
  Filled 2016-01-25 (×3): qty 1

## 2016-01-25 MED ORDER — POLYVINYL ALCOHOL 1.4 % OP SOLN
1.0000 [drp] | Freq: Every day | OPHTHALMIC | Status: DC | PRN
  Filled 2016-01-25: qty 15

## 2016-01-25 MED ORDER — SODIUM CHLORIDE 0.9% FLUSH
3.0000 mL | Freq: Two times a day (BID) | INTRAVENOUS | Status: DC
  Administered 2016-01-25 – 2016-01-26 (×5): 3 mL via INTRAVENOUS

## 2016-01-25 MED ORDER — MEPERIDINE HCL 25 MG/ML IJ SOLN: 6.2500 mg | INTRAMUSCULAR | Status: DC | PRN

## 2016-01-25 NOTE — Anesthesia Preprocedure Evaluation (Addendum)
Anesthesia Evaluation  Patient identified by MRN, date of birth, ID band Patient awake    Reviewed: Allergy & Precautions, NPO status , Patient's Chart, lab work & pertinent test results, reviewed documented beta blocker date and time   Airway Mallampati: III  TM Distance: >3 FB Neck ROM: Full    Dental  (+) Poor Dentition, Missing, Edentulous Upper,    Pulmonary former smoker,    Pulmonary exam normal breath sounds clear to auscultation       Cardiovascular hypertension, Pt. on medications and Pt. on home beta blockers + CAD  Normal cardiovascular exam Rhythm:Regular Rate:Normal     Neuro/Psych negative neurological ROS  negative psych ROS   GI/Hepatic GERD  Medicated and Controlled,(+) Cirrhosis   Esophageal Varices  substance abuse  marijuana use, Hepatitis -, CGI Bleeding   Endo/Other  diabetes, Well Controlled, Type 2, Oral Hypoglycemic AgentsObesity  Renal/GU negative Renal ROS  negative genitourinary   Musculoskeletal  (+) Arthritis , Osteoarthritis,    Abdominal (+) + obese,   Peds  Hematology  (+) anemia , Thrombocytopenia Coagulopathy   Anesthesia Other Findings   Reproductive/Obstetrics                          Anesthesia Physical Anesthesia Plan  ASA: III  Anesthesia Plan: MAC   Post-op Pain Management:    Induction: Intravenous  Airway Management Planned: Natural Airway and Nasal Cannula  Additional Equipment:   Intra-op Plan:   Post-operative Plan:   Informed Consent: I have reviewed the patients History and Physical, chart, labs and discussed the procedure including the risks, benefits and alternatives for the proposed anesthesia with the patient or authorized representative who has indicated his/her understanding and acceptance.   Dental advisory given  Plan Discussed with: CRNA, Anesthesiologist and Surgeon  Anesthesia Plan Comments:          Anesthesia Quick Evaluation

## 2016-01-25 NOTE — ED Notes (Signed)
MD at bedside. 

## 2016-01-25 NOTE — H&P (View-Only) (Signed)
Consultation  Referring Provider: [facility] Primary Care Physician:  Chesley Noon, MD Primary Gastroenterologist:  Roosevelt Locks NP [facility]  Reason for Consultation:   GI bleed  HPI: Stacy Richard is a 70 y.o. female who was admitted last evening through the emergency room after she developed acute onset of hematemesis at about 10:00 last night. She says she vomited black coffee-ground material 3 times at home. She then came on to the emergency room because she felt weak and lightheaded. She had not noted any melena or hematochezia over the past few days. She denies any abdominal pain. Patient does have history of hepatitis C/cirrhosis and is being followed at the [facility] hepatology clinic in [place] and just started treatment with Harvoni, and [place]. She had an admission in December 2016 with a similar GI bleed. She was seen by [facility] at that time and underwent upper endoscopy which showed small distal varices with no stigmata of bleeding and mild portal gastropathy. She also had colonoscopy which was a normal exam. She did not require transfusion during that admission. She has not been on any aspirin or NSAIDs at home and denies any EtOH. Hemoglobin was 13 on admission and hemoglobin 10.8 today. INR 1.37. Patient has been hemodynamically stable since arrival to the emergency room. She's been placed on Rocephin, IV Pepcid, and octreotide. She has not had any further hematemesis , did have 1 black stool today.   Past Medical History  Diagnosis Date  . GERD (gastroesophageal reflux disease)   . Allergy     Rhinitis  . Hepatitis C   . Osteopenia   . NIDDM (non-insulin dependent diabetes mellitus)   . Osteoarthritis of knee     right knee  . Hypertension   . Arthritis     left hip  . Hepatic cirrhosis [facility])     Past Surgical History  Procedure Laterality Date  . Knee surgery    . Transthoracic echocardiogram  09/2012    EF 55-54%, mild  LVH & mild conc hypertrophy, grade 1 diastolic dysfunction; mild MR  . Nm myocar perf wall motion  09/2012    lexiscan myoview; normal study, low risk  . Esophagogastroduodenoscopy (egd) with propofol N/A 08/17/2015    Procedure: ESOPHAGOGASTRODUODENOSCOPY (EGD) WITH PROPOFOL;  Surgeon: Clarene Essex, MD;  Location: [facility]COPY;  Service: Endoscopy;  Laterality: N/A;  . Esophageal banding N/A 08/17/2015    Procedure: ESOPHAGEAL BANDING;  Surgeon: Clarene Essex, MD;  Location: [facility]OSCOPY;  Service: Endoscopy;  Laterality: N/A;  ? banding  . Colonoscopy with propofol Left 08/21/2015    Procedure: COLONOSCOPY WITH PROPOFOL;  Surgeon: Teena Irani, MD;  Location: [facility]OSCOPY;  Service: Endoscopy;  Laterality: Left;    Prior to Admission medications   Medication Sig Start Date End Date Taking? Authorizing Provider  Artificial Tear Ointment (ARTIFICIAL TEARS) ointment Place 1 application into both eyes as needed.    Yes Historical Provider, MD  busPIRone (BUSPAR) 7.5 MG tablet Take 1 tablet (7.5 mg total) by mouth daily. 01/03/16  Yes Merian Capron, MD  Calcium Carb-Cholecalciferol (CALTRATE 600+D) 600-800 MG-UNIT TABS Take 1 tablet by mouth 2 (two) times daily.   Yes Historical Provider, MD  calcium carbonate (TUMS - DOSED IN MG ELEMENTAL CALCIUM) 500 MG chewable tablet Chew 2 tablets by mouth 3 (three) times daily as needed for indigestion or heartburn.   Yes Historical Provider, MD  DULoxetine (CYMBALTA) 60 MG capsule Take 1 capsule (60 mg total) by mouth daily.  01/03/16  Yes Merian Capron, MD  erythromycin ophthalmic ointment Place 1 application into both eyes at bedtime.   Yes Historical Provider, MD  lactulose (CHRONULAC) 10 GM/15ML solution Take 10 g by mouth 2 (two) times daily.   Yes Historical Provider, MD  Ledipasvir-Sofosbuvir (HARVONI) 90-400 MG TABS Take 1 tablet by mouth daily. 01/08/16  Yes Historical Provider, MD  loratadine (CLARITIN) 10 MG tablet Take 10 mg by mouth daily.   Yes Historical  Provider, MD  metoprolol succinate (TOPROL XL) 50 MG 24 hr tablet Take 50 mg by mouth daily.  01/22/16 01/21/17 Yes Historical Provider, MD  Multiple Vitamin (MULTIVITAMIN WITH MINERALS) TABS tablet Take 1 tablet by mouth daily. Multivitamin with lutein and lycopene   Yes Historical Provider, MD  oxybutynin (DITROPAN-XL) 5 MG 24 hr tablet Take 5 mg by mouth daily.   Yes Historical Provider, MD  Polyethyl Glycol-Propyl Glycol (SYSTANE OP) Apply 1 drop to eye daily as needed (dry eye).    Yes Historical Provider, MD  ribavirin (REBETOL) 200 MG capsule Take 200 mg by mouth 2 (two) times daily.   Yes Historical Provider, MD    Current Facility-Administered Medications  Medication Dose Route Frequency Provider Last Rate Last Dose  . 0.9 %  sodium chloride infusion   Intravenous Continuous Ivor Costa, MD 75 mL/hr at 01/25/16 0259    . antiseptic oral rinse (CPC / CETYLPYRIDINIUM CHLORIDE 0.05%) solution 7 mL  7 mL Mouth Rinse BID Ivor Costa, MD   7 mL at 01/25/16 1045  . artificial tears (LACRILUBE) ophthalmic ointment 1 application  1 application Both Eyes PRN Ivor Costa, MD      . busPIRone (BUSPAR) tablet 7.5 mg  7.5 mg Oral Daily Ivor Costa, MD   7.5 mg at 01/25/16 1059  . calcium carbonate (TUMS - dosed in mg elemental calcium) chewable tablet 400 mg of elemental calcium  2 tablet Oral TID PRN Ivor Costa, MD      . calcium-vitamin D (OSCAL WITH D) 500-200 MG-UNIT per tablet 1 tablet  1 tablet Oral BID Ivor Costa, MD   1 tablet at 01/25/16 [id number]  . cefTRIAXone (ROCEPHIN) 2 g in dextrose 5 % 50 mL IVPB  2 g Intravenous Q24H Ivor Costa, MD      . DULoxetine (CYMBALTA) DR capsule 60 mg  60 mg Oral Daily Ivor Costa, MD   60 mg at 01/25/16 1008  . erythromycin ophthalmic ointment 1 application  1 application Both Eyes QHS Ivor Costa, MD   1 application at [phone]  . famotidine (PEPCID) IVPB 20 mg premix  20 mg Intravenous Q12H Ivor Costa, MD 100 mL/hr at 01/25/16 1008 20 mg at 01/25/16 1008  . insulin aspart  (novoLOG) injection 0-5 Units  0-5 Units Subcutaneous QHS Ivor Costa, MD      . insulin aspart (novoLOG) injection 0-9 Units  0-9 Units Subcutaneous TID WC Ivor Costa, MD   2 Units at 01/25/16 [phone]  . lactulose (CHRONULAC) 10 GM/15ML solution 10 g  10 g Oral BID Ivor Costa, MD   10 g at 01/25/16 1007  . Ledipasvir-Sofosbuvir 90-400 MG TABS 1 tablet  1 tablet Oral Daily Ivor Costa, MD      . loratadine (CLARITIN) tablet 10 mg  10 mg Oral Daily Ivor Costa, MD   10 mg at 01/25/16 1008  . metoprolol succinate (TOPROL-XL) 24 hr tablet 50 mg  50 mg Oral Daily Ivor Costa, MD   50 mg at 01/25/16 1059  . multivitamin  with minerals tablet 1 tablet  1 tablet Oral Daily Ivor Costa, MD   1 tablet at 01/25/16 1039  . octreotide (SANDOSTATIN) 500 mcg in sodium chloride 0.9 % 250 mL (2 mcg/mL) infusion  50 mcg/hr Intravenous Continuous Everlene Balls, MD 25 mL/hr at 01/25/16 1014 50 mcg/hr at 01/25/16 1014  . ondansetron (ZOFRAN) injection 4 mg  4 mg Intravenous Q8H PRN Ivor Costa, MD      . oxybutynin (DITROPAN-XL) 24 hr tablet 5 mg  5 mg Oral Daily Ivor Costa, MD   5 mg at 01/25/16 1008  . polyvinyl alcohol (LIQUIFILM TEARS) 1.4 % ophthalmic solution 1 drop  1 drop Both Eyes Daily PRN Ivor Costa, MD      . ribavirin (REBETOL) capsule 200 mg  200 mg Oral BID Ivor Costa, MD      . sodium chloride flush (NS) 0.9 % injection 3 mL  3 mL Intravenous Q12H Ivor Costa, MD   3 mL at 01/25/16 1042    Allergies as of 01/24/2016  . (No Known Allergies)    Family History  Problem Relation Age of Onset  . Heart failure Mother   . Hepatitis Mother   . Kidney cancer Mother   . Heart disease Father   . Heart Problems Sister     murmur  . Diabetes type II Sister   . Heart Problems Daughter     murmur  . Bipolar disorder Daughter     Social History   Social History  . Marital Status: Widowed    Spouse Name: N/A  . Number of Children: 4  . Years of Education: N/A   Occupational History  . Not on file.   Social History  Main Topics  . Smoking status: Former Smoker    Quit date: 10/28/2009  . Smokeless tobacco: Never Used  . Alcohol Use: No  . Drug Use: Yes    Special: Marijuana  . Sexual Activity: Not Currently   Other Topics Concern  . Not on file   Social History Narrative    Review of Systems: Pertinent positive and negative review of systems were noted in the above HPI section.  All other review of systems was otherwise negative.  Physical Exam: Vital signs in last 24 hours: Temp:  [97.1 F (36.2 C)-98.7 F (37.1 C)] 98.7 F (37.1 C) (05/18 0800) Pulse Rate:  [75-92] 80 (05/18 0800) Resp:  [14-23] 22 (05/18 0800) BP: (101-156)/(50-93) 110/66 mmHg (05/18 0800) SpO2:  [97 %-100 %] 99 % (05/18 0800) Weight:  [170 lb 3.1 oz (77.2 kg)] 170 lb 3.1 oz (77.2 kg) (05/18 0254) Last BM Date: 01/24/16 General:   Alert,  Well-developed, well-nourished,older female  pleasant and cooperative in NAD Head:  Normocephalic and atraumatic. Eyes:  Sclera clear, no icterus.   Conjunctiva pink. Ears:  Normal auditory acuity. Nose:  No deformity, discharge,  or lesions. Mouth:  No deformity or lesions.   Neck:  Supple; no masses or thyromegaly. Lungs:  Clear throughout to auscultation.   No wheezes, crackles, or rhonchi. Heart:  Regular rate and rhythm; no murmurs, clicks, rubs,  or gallops. Abdomen:  Soft,nontender, BS active,nonpalp mass or hsm, no appreciable fluid wave.   Rectal:  Deferred  Msk:  Symmetrical without gross deformities. . Pulses:  Normal pulses noted. Extremities:  Without clubbing or edema. Neurologic:  Alert and  oriented x4;  grossly normal neurologically. No asterixis Skin:  Intact without significant lesions or rashes.. Psych:  Alert and cooperative. Normal mood and affect.  Intake/Output from previous day: 05/17 0701 - 05/18 0700 In: 414.6 [I.V.:364.6; IV Piggyback:50] Out: -  Intake/Output this shift: Total I/O In: 100 [I.V.:100] Out: -   Lab Results:  Recent Labs   01/25/16 0020 01/25/16 0207 01/25/16 0739  WBC 12.8* 12.3* 11.2*  HGB 13.1 11.0* 10.8*  HCT 40.4 32.7* 32.7*  PLT 138* 118* 105*   BMET  Recent Labs  01/24/16 2356 01/25/16 0316  NA 140 139  K 3.6 3.8  CL 102 106  CO2 27 24  GLUCOSE 184* 147*  BUN 35* 30*  CREATININE 1.15* 0.93  CALCIUM 9.5 8.3*   LFT  Recent Labs  01/25/16 0316  PROT 7.6  ALBUMIN 2.8*  AST 37  ALT 26  ALKPHOS 106  BILITOT 2.2*   PT/INR  Recent Labs  01/24/16 2356  LABPROT 16.9*  INR 1.37     IMPRESSION:  #70   70 year old female admitted with hematemesis/coffee ground emesis in setting of known hep C/cirrhosis and prior history of acute upper GI bleed felt secondary to portal gastropathy. She does have distal esophageal varices which were felt to be small at the time of last EGD December 2016. Patient has not had any evidence of ongoing hemorrhage since admission, feel variceal bleed less likely, and more likely secondary to portal gastropathy. #2 mild anemia secondary to above #3 history of hypertension-no blood pressures recently have been on the low side next line #4 coronary artery disease #5 adult-onset diabetes mellitus  PLAN: Patient is scheduled for upper endoscopy this afternoon with Dr. Loletha Carrow. Procedure discussed in detail with patient including risks and benefits and she is agreeable to proceed. Continue octreotide and IV Pepcid for now pending EGD findings Serial hemoglobins and transfuse for hemoglobin 7 or less Rocephin was started prophylactically It appears that she has not been on a beta blocker at home, does have Toprol on her med list but unclear according to recent outpatient cardiology evaluation with Dr. Debara Pickett that she's actually been taking the Toprol. She would benefit from a nonselective beta blocker and would plan to switch her to nadolol on discharge  Thank you for consult we will follow with you    ( did not intentionally underline , and cannot seem to  delete)  Stacy Richard  01/25/2016, 12:26 PM

## 2016-01-25 NOTE — Progress Notes (Signed)
PROGRESS NOTE    Yarelie Burky  [id number] DOB: [birth year] DOA: 01/24/2016  PCP: Chesley Noon, MD    Brief Narrative:  Stacy Richard is a 70 y.o. female with medical history significant of hepatitis C cirrhosis, esophageal varice, hypertension, diabetes, GERD, CAD, depression, arthritis, who presents with coffee-ground emesis. She had 3 episodes of coffee ground emesis on the day of admission.   Subjective: Mild nausea. No pain or vomiting. No cough, dyspnea, diarrhea.   Assessment & Plan:   Principal Problem:   Upper GI bleed - 12/16- EGD showing distal small esophageal varices and portal gastropathy - prior Colonscopy unrevealing - cont IV Pepcid (cannot use PPI due to hep C treatment) and Octreotide - have called GI- cont NPO for EGD later today - slow IVF - Rocephin for SBP prophylaxis  Active Problems:  GERD - pepcid IV  Renal insuficiency - improved with IVF  Anemia - hb at baseline- likely anemia of chronic disease  Thrombocytopenia - chronic    Hepatitis C - Harvoni and Ribavirin started 5/3 - cont Lactulose    Type 2 diabetes mellitus  - ISS while here - normally diet controlled with HbA1c of 6.4  Depression/ anxiety - Cymbalta, Buspar  HTN - Metoprolol   DVT prophylaxis: SCDs Code Status: full code Family Communication:  Disposition Plan: 2-3 day stay expected Consultants:   GI Procedures:   none Antimicrobials:  Anti-infectives    Start     Dose/Rate Route Frequency Ordered Stop   01/25/16 2200  cefTRIAXone (ROCEPHIN) 2 g in dextrose 5 % 50 mL IVPB     2 g 100 mL/hr over 30 Minutes Intravenous Every 24 hours 01/25/16 0201     01/25/16 1000  Ledipasvir-Sofosbuvir 90-400 MG TABS 1 tablet     1 tablet Oral Daily 01/25/16 0201     01/25/16 1000  ribavirin (REBETOL) capsule 200 mg     200 mg Oral 2 times daily 01/25/16 0201     01/25/16 0000  cefTRIAXone (ROCEPHIN) 2 g in dextrose 5 % 50 mL IVPB     2 g 100 mL/hr over 30 Minutes  Intravenous  Once 01/24/16 2359 01/25/16 0130       Objective: Filed Vitals:   01/25/16 0500 01/25/16 0600 01/25/16 0700 01/25/16 0800  BP: 144/69 127/85 113/69 110/66  Pulse: 84 84 82 80  Temp:    98.7 F (37.1 C)  TempSrc:    Oral  Resp: 20 22 23 22   Height:      Weight:      SpO2: 99% 98% 100% 99%    Intake/Output Summary (Last 24 hours) at 01/25/16 1302 Last data filed at 01/25/16 0800  Gross per 24 hour  Intake 514.58 ml  Output      0 ml  Net 514.58 ml   Filed Weights   01/25/16 0254  Weight: 77.2 kg (170 lb 3.1 oz)    Examination: General exam: Appears comfortable  HEENT: PERRLA, oral mucosa moist, no sclera icterus or thrush Respiratory system: Clear to auscultation. Respiratory effort normal. Cardiovascular system: S1 & S2 heard, RRR.  No murmurs  Gastrointestinal system: Abdomen soft, non-tender, nondistended. Normal bowel sound. No organomegaly Central nervous system: Alert and oriented. No focal neurological deficits. Extremities: No cyanosis, clubbing or edema Skin: No rashes or ulcers Psychiatry:  Mood & affect appropriate.     Data Reviewed: I have personally reviewed following labs and imaging studies  CBC:  Recent Labs Lab 01/25/16 0020 01/25/16 0207 01/25/16 [id number]  WBC 12.8* 12.3* 11.2*  HGB 13.1 11.0* 10.8*  HCT 40.4 32.7* 32.7*  MCV 92.4 91.3 92.1  PLT 138* 118* [id number]*   Basic Metabolic Panel:  Recent Labs Lab 01/24/16 2356 01/25/16 0316  NA 140 139  K 3.6 3.8  CL 102 106  CO2 27 24  GLUCOSE 184* 147*  BUN 35* 30*  CREATININE 1.15* 0.93  CALCIUM 9.5 8.3*   GFR: Estimated Creatinine Clearance: 51.9 mL/min (by C-G formula based on Cr of 0.93). Liver Function Tests:  Recent Labs Lab 01/24/16 2356 01/25/16 0316  AST 42* 37  ALT 27 26  ALKPHOS 117 106  BILITOT 2.9* 2.2*  PROT 8.2* 7.6  ALBUMIN 3.1* 2.8*   No results for input(s): LIPASE, AMYLASE in the last 168 hours. No results for input(s): AMMONIA in the last 168  hours. Coagulation Profile:  Recent Labs Lab 01/24/16 2356  INR 1.37   Cardiac Enzymes: No results for input(s): CKTOTAL, CKMB, CKMBINDEX, TROPONINI in the last 168 hours. BNP (last 3 results) No results for input(s): PROBNP in the last 8760 hours. HbA1C: No results for input(s): HGBA1C in the last 72 hours. CBG:  Recent Labs Lab 01/25/16 0737 01/25/16 1216  GLUCAP 158* 140*   Lipid Profile: No results for input(s): CHOL, HDL, LDLCALC, TRIG, CHOLHDL, LDLDIRECT in the last 72 hours. Thyroid Function Tests: No results for input(s): TSH, T4TOTAL, FREET4, T3FREE, THYROIDAB in the last 72 hours. Anemia Panel: No results for input(s): VITAMINB12, FOLATE, FERRITIN, TIBC, IRON, RETICCTPCT in the last 72 hours. Urine analysis: No results found for: COLORURINE, APPEARANCEUR, LABSPEC, PHURINE, GLUCOSEU, HGBUR, BILIRUBINUR, KETONESUR, PROTEINUR, UROBILINOGEN, NITRITE, LEUKOCYTESUR Sepsis Labs: @LABRCNTIP (procalcitonin:4,lacticidven:4) Recent Results (from the past 240 hour(s))  MRSA PCR Screening     Status: None   Collection Time: 01/25/16  2:52 AM  Result Value Ref Range Status   MRSA by PCR NEGATIVE NEGATIVE Final    Comment:        The GeneXpert MRSA Assay (FDA approved for NASAL specimens only), is one component of a comprehensive MRSA colonization surveillance program. It is not intended to diagnose MRSA infection nor to guide or monitor treatment for MRSA infections.      Radiology Studies: No results found.  Scheduled Meds: . antiseptic oral rinse  7 mL Mouth Rinse BID  . busPIRone  7.5 mg Oral Daily  . calcium-vitamin D  1 tablet Oral BID  . cefTRIAXone (ROCEPHIN)  IV  2 g Intravenous Q24H  . DULoxetine  60 mg Oral Daily  . erythromycin  1 application Both Eyes QHS  . famotidine (PEPCID) IV  20 mg Intravenous Q12H  . insulin aspart  0-5 Units Subcutaneous QHS  . insulin aspart  0-9 Units Subcutaneous TID WC  . lactulose  10 g Oral BID  .  Ledipasvir-Sofosbuvir  1 tablet Oral Daily  . loratadine  10 mg Oral Daily  . metoprolol succinate  50 mg Oral Daily  . multivitamin with minerals  1 tablet Oral Daily  . oxybutynin  5 mg Oral Daily  . ribavirin  200 mg Oral BID  . sodium chloride flush  3 mL Intravenous Q12H   Continuous Infusions: . sodium chloride 75 mL/hr at 01/25/16 0259  . octreotide  (SANDOSTATIN)    IV infusion 50 mcg/hr (01/25/16 1014)     LOS: 0 days    Time spent in minutes: 90    [place], MD [facility] Pager: [email or web address] Password [email or web address] 01/25/2016, 1:02 PM

## 2016-01-25 NOTE — Progress Notes (Signed)
Patient having chest pain. She states she has had discomfort for a week but this is much worse. 12 lead completed and midlevel paged with update and EKG results. RN to continue to monitor.

## 2016-01-25 NOTE — Progress Notes (Signed)
OT Cancellation Note  Patient Details Name: Stacy Richard MRN: [id number] DOB: [birth year]   Cancelled Treatment:    Reason Eval/Treat Not Completed: Patient at procedure or test/ unavailable  Spoke with RN.  Pt getting ready to go down for EGD  Will recheck on pt next day.  Kari Baars, [place]  Payton Mccallum D 01/25/2016, 1:28 PM

## 2016-01-25 NOTE — H&P (Signed)
History and Physical    Stacy Richard [id number] DOB: [birth year] DOA: 01/24/2016  Referring MD/NP/PA:   PCP: Chesley Noon, MD   Patient coming from:  The patient is coming from home. At his baseline, she is independent for most of his ADL.  Chief Complaint: Coffee-ground emesis  HPI: Stacy Richard is a 70 y.o. female with medical history significant of hepatitis C, hepatic cirrhosis, esophageal varice, hypertension, diabetes, GERD, CAD, depression, arthritis, who presents with coffee-ground emesis.  Patient reports that she had 3 episode of coffee-ground emesis since this morning. First episode was with large amount of blood. Patient has nausea, but no abdominal pain or diarrhea. No fever or chills. Patient does not have dizziness, chest pain, shortness breath. She denies symptoms of UTI or unilateral weakness.  # EGD on 08/17/15 by Dr. Watt Climes: 1.Small distal esophageal varices without at risk stigmata2.Small hiatal hernia 3. Mild proximal portal gastropathy 4. Otherwise within normal limits EGD to the second portion of the duodenum without any signs of active bleeding.  # Colonoscopy on 03/21/15 by Dr. Amedeo Plenty was negative.  ED Course: pt was found to have hemoglobin 9.1 on 08/21/15-->13.1, WBC 12.8, INR 1.37, no tachycardia, no tachypnea, mild acute renal injury with creatinine 1.15. Patient is admitted to SDU for further evaluation and treatment.  Review of Systems:   General: no fevers, chills, no changes in body weight, has poor appetite, has fatigue HEENT: no blurry vision, hearing changes or sore throat Pulm: no dyspnea, coughing, wheezing CV: no chest pain, no palpitations Abd: has nausea, vomiting, no abdominal pain, diarrhea, constipation GU: no dysuria, burning on urination, increased urinary frequency, hematuria  Ext: no leg edema Neuro: no unilateral weakness, numbness, or tingling, no vision change or hearing loss Skin: no rash MSK: No muscle spasm, no deformity, no  limitation of range of movement in spin Heme: No easy bruising.  Travel history: No recent long distant travel.  Allergy: No Known Allergies  Past Medical History  Diagnosis Date  . GERD (gastroesophageal reflux disease)   . Allergy     Rhinitis  . Hepatitis C   . Osteopenia   . NIDDM (non-insulin dependent diabetes mellitus)   . Osteoarthritis of knee     right knee  . Hypertension   . Arthritis     left hip  . Hepatic cirrhosis [facility])     Past Surgical History  Procedure Laterality Date  . Knee surgery    . Transthoracic echocardiogram  09/2012    EF 55-54%, mild LVH & mild conc hypertrophy, grade 1 diastolic dysfunction; mild MR  . Nm myocar perf wall motion  09/2012    lexiscan myoview; normal study, low risk  . Esophagogastroduodenoscopy (egd) with propofol N/A 08/17/2015    Procedure: ESOPHAGOGASTRODUODENOSCOPY (EGD) WITH PROPOFOL;  Surgeon: Clarene Essex, MD;  Location: [facility];  Service: Endoscopy;  Laterality: N/A;  . Esophageal banding N/A 08/17/2015    Procedure: ESOPHAGEAL BANDING;  Surgeon: Clarene Essex, MD;  Location: [facility];  Service: Endoscopy;  Laterality: N/A;  ? banding  . Colonoscopy with propofol Left 08/21/2015    Procedure: COLONOSCOPY WITH PROPOFOL;  Surgeon: Teena Irani, MD;  Location: [facility];  Service: Endoscopy;  Laterality: Left;    Social History:  reports that she quit smoking about 6 years ago. She has never used smokeless tobacco. She reports that she uses illicit drugs (Marijuana). She reports that she does not drink alcohol.  Family History:  Family History  Problem Relation Age of Onset  .  Heart failure Mother   . Hepatitis Mother   . Kidney cancer Mother   . Heart disease Father   . Heart Problems Sister     murmur  . Diabetes type II Sister   . Heart Problems Daughter     murmur  . Bipolar disorder Daughter      Prior to Admission medications   Medication Sig Start Date End Date Taking? Authorizing Provider  Artificial  Tear Ointment (ARTIFICIAL TEARS) ointment Place 1 application into both eyes as needed.    Yes Historical Provider, MD  busPIRone (BUSPAR) 7.5 MG tablet Take 1 tablet (7.5 mg total) by mouth daily. 01/03/16  Yes Merian Capron, MD  Calcium Carb-Cholecalciferol (CALTRATE 600+D) 600-800 MG-UNIT TABS Take 1 tablet by mouth 2 (two) times daily.   Yes Historical Provider, MD  calcium carbonate (TUMS - DOSED IN MG ELEMENTAL CALCIUM) 500 MG chewable tablet Chew 2 tablets by mouth 3 (three) times daily as needed for indigestion or heartburn.   Yes Historical Provider, MD  DULoxetine (CYMBALTA) 60 MG capsule Take 1 capsule (60 mg total) by mouth daily. 01/03/16  Yes Merian Capron, MD  erythromycin ophthalmic ointment Place 1 application into both eyes at bedtime.   Yes Historical Provider, MD  lactulose (CHRONULAC) 10 GM/15ML solution Take 10 g by mouth 2 (two) times daily.   Yes Historical Provider, MD  Ledipasvir-Sofosbuvir (HARVONI) 90-400 MG TABS Take 1 tablet by mouth daily. 01/08/16  Yes Historical Provider, MD  loratadine (CLARITIN) 10 MG tablet Take 10 mg by mouth daily.   Yes Historical Provider, MD  metoprolol succinate (TOPROL XL) 50 MG 24 hr tablet Take 50 tablets by mouth daily. 01/22/16 01/21/17 Yes Historical Provider, MD  Multiple Vitamin (MULTIVITAMIN WITH MINERALS) TABS tablet Take 1 tablet by mouth daily. Multivitamin with lutein and lycopene   Yes Historical Provider, MD  oxybutynin (DITROPAN-XL) 5 MG 24 hr tablet Take 5 mg by mouth daily.   Yes Historical Provider, MD  Polyethyl Glycol-Propyl Glycol (SYSTANE OP) Apply 1 drop to eye daily as needed (dry eye).    Yes Historical Provider, MD  ribavirin (REBETOL) 200 MG capsule Take 200 mg by mouth 2 (two) times daily.   Yes Historical Provider, MD    Physical Exam: Filed Vitals:   01/25/16 0030 01/25/16 0100  BP: 124/75 117/68  Pulse: 87 75  Resp: 19 19  SpO2: 100% 98%   General: Not in acute distress HEENT:       Eyes: PERRL, EOMI, no  scleral icterus.       ENT: No discharge from the ears and nose, no pharynx injection, no tonsillar enlargement.        Neck: No JVD, no bruit, no mass felt. Heme: No neck lymph node enlargement. Cardiac: S1/S2, RRR, No murmurs, No gallops or rubs. Pulm:  No rales, wheezing, rhonchi or rubs. Abd: Soft, nondistended, nontender, no rebound pain, no organomegaly, BS present. GU: No hematuria Ext: No pitting leg edema bilaterally. 2+DP/PT pulse bilaterally. Musculoskeletal: No joint deformities, No joint redness or warmth, no limitation of ROM in spin. Skin: No rashes.  Neuro: Alert, oriented X3, cranial nerves II-XII grossly intact, moves all extremities normally. Psych: Patient is not psychotic, no suicidal or hemocidal ideation.  Labs on Admission: I have personally reviewed following labs and imaging studies  CBC:  Recent Labs Lab 01/25/16 0020  WBC 12.8*  HGB 13.1  HCT 40.4  MCV 92.4  PLT 0000000*   Basic Metabolic Panel:  Recent Labs Lab  01/24/16 2356  NA 140  K 3.6  CL 102  CO2 27  GLUCOSE 184*  BUN 35*  CREATININE 1.15*  CALCIUM 9.5   GFR: CrCl cannot be calculated (Unknown ideal weight.). Liver Function Tests:  Recent Labs Lab 01/24/16 2356  AST 42*  ALT 27  ALKPHOS 117  BILITOT 2.9*  PROT 8.2*  ALBUMIN 3.1*   No results for input(s): LIPASE, AMYLASE in the last 168 hours. No results for input(s): AMMONIA in the last 168 hours. Coagulation Profile:  Recent Labs Lab 01/24/16 2356  INR 1.37   Cardiac Enzymes: No results for input(s): CKTOTAL, CKMB, CKMBINDEX, TROPONINI in the last 168 hours. BNP (last 3 results) No results for input(s): PROBNP in the last 8760 hours. HbA1C: No results for input(s): HGBA1C in the last 72 hours. CBG: No results for input(s): GLUCAP in the last 168 hours. Lipid Profile: No results for input(s): CHOL, HDL, LDLCALC, TRIG, CHOLHDL, LDLDIRECT in the last 72 hours. Thyroid Function Tests: No results for input(s): TSH,  T4TOTAL, FREET4, T3FREE, THYROIDAB in the last 72 hours. Anemia Panel: No results for input(s): VITAMINB12, FOLATE, FERRITIN, TIBC, IRON, RETICCTPCT in the last 72 hours. Urine analysis: No results found for: COLORURINE, APPEARANCEUR, LABSPEC, PHURINE, GLUCOSEU, HGBUR, BILIRUBINUR, KETONESUR, PROTEINUR, UROBILINOGEN, NITRITE, LEUKOCYTESUR Sepsis Labs: @LABRCNTIP (procalcitonin:4,lacticidven:4) )No results found for this or any previous visit (from the past 240 hour(s)).   Radiological Exams on Admission: No results found.   EKG:  Not done in ED, will get one.   Assessment/Plan Principal Problem:   Upper GI bleed Active Problems:   GERD (gastroesophageal reflux disease)   Hepatitis C   Type 2 diabetes mellitus (HCC)   Osteoarthritis of knee   HTN (hypertension)   CAD (coronary artery disease)   Hepatic cirrhosis (HCC)   GIB (gastrointestinal bleeding)   Leukocytosis   Upper GI bleed: this is most likely due to esophageal varices bleeding. Hemoglobin is 13.1. Hemodynamically stable currently. Patient is at high risk of recurrent massive bleeding.  - Will admit to SDU - Please call GI in AM - NPO for possible EGD - IFV: 1L NS and then 75 mL/hr - Start IV pepcid (per pharm, pantoprazole has interaction with her HCV medications) - IV Octreotide infusion - On BB, metoprolol - Zofran IV for nausea - Avoid NSAIDs and SQ heparin - Maintain IV access (2 large bore IVs if possible). - Monitor closely and follow q6h cbc, transfuse as necessary. - LaB: INR, PTT and type/screen - IV Rocephin 1 g X 7 days for PPx of SBP  GERD: - on Pepcid IV  Diet-controlled DM-II: Last A1c 6.4. well controled. Patient is not taking meds at home. Blood sugar 184 -SSI  Hepatitis C/hepatic cirrhosis: pt started Harvoni and ribavirin on 01/10/16, need to take for 12 weeks. Liver functions okay, AST 42, ALT 23, total bilirubin 2.9. -continue home meds. -continue Lactulose  Hypertension: -Continue  metoprolol  CAD (coronary artery disease): No chest pain. -Continue metoprolol  Leukocytosis: no signs of infection. Likely due to stress induced to demargination -follow up by CBC  Depression: Stable, no suicidal or homicidal ideations. -Continue home medications: BuSpar and Cymbalta   DVT ppx: SCD Code Status: Full code Family Communication: None at bed side.  Disposition Plan:  Anticipate discharge back to previous home environment Consults called:  Not yet, will need to call GI in AM Admission status:  SDU/inpation       Date of Service 01/25/2016    Ivor Costa [facility]  Pager [phone]  If 7PM-7AM, please contact night-coverage [email or web address] Password [email or web address] 01/25/2016, 2:29 AM

## 2016-01-25 NOTE — Care Management Note (Signed)
Case Management Note  Patient Details  Name: Stacy Richard MRN: [id number] Date of Birth: 02-05-1946  Subjective/Objective:          Upper gi bld poss ruptured esophogeal varices          Action/Plan:Date:  Jan 25, 2016 Chart reviewed for concurrent status and case management needs. Will continue to follow patient for changes and needs: Expected discharge date: [id number] Velva Harman, BSN, [place], [place]   Expected Discharge Date:                  Expected Discharge Plan:  Home/Self Care  In-House Referral:  NA  Discharge planning Services  CM Consult  Post Acute Care Choice:  NA Choice offered to:  NA  DME Arranged:  N/A DME Agency:  NA  HH Arranged:  NA HH Agency:  NA  Status of Service:  In process, will continue to follow  Medicare Important Message Given:    Date Medicare IM Given:    Medicare IM give by:    Date Additional Medicare IM Given:    Additional Medicare Important Message give by:     If discussed at [place] of Stay Meetings, dates discussed:    Additional Comments:  Leeroy Cha, RN 01/25/2016, 9:55 AM

## 2016-01-25 NOTE — Op Note (Signed)
[facility] Patient Name: Stacy Richard Procedure Date: 01/25/2016 MRN: [id number] Attending MD: Stacy Richard. Stacy Richard , MD Date of Birth: 05-Dec-1945 CSN: [id number] Age: 70 Admit Type: Inpatient Procedure:                Upper GI endoscopy Indications:              Coffee-ground emesis, Portal hypertensive                            gastropathy, Esophageal varices Providers:                Stacy Mussel L. Stacy Carrow, MD, Stacy Plan, RN, Stacy Richard, Technician Referring MD:              Medicines:                Monitored Anesthesia Care Complications:            No immediate complications. Estimated Blood Loss:     Estimated blood loss: none. Procedure:                Pre-Anesthesia Assessment:                           - Prior to the procedure, a History and Physical                            was performed, and patient medications and                            allergies were reviewed. The patient's tolerance of                            previous anesthesia was also reviewed. The risks                            and benefits of the procedure and the sedation                            options and risks were discussed with the patient.                            All questions were answered, and informed consent                            was obtained. Prior Anticoagulants: The patient has                            taken no previous anticoagulant or antiplatelet                            agents. ASA Grade Assessment: III - A patient with                            severe  systemic disease. After reviewing the risks                            and benefits, the patient was deemed in                            satisfactory condition to undergo the procedure.                           After obtaining informed consent, the endoscope was                            passed under direct vision. Throughout the                            procedure, the patient's  blood pressure, pulse, and                            oxygen saturations were monitored continuously. The                            [id number]) scope was introduced through the                            mouth, and advanced to the second part of duodenum.                            The upper GI endoscopy was accomplished without                            difficulty. The patient tolerated the procedure                            well. Scope In: Scope Out: Findings:      Grade III varices were found in the middle third of the esophagus and in       the lower third of the esophagus.      Type 2 gastroesophageal varices (GOV2, esophageal varices which extend       along the fundus) with no bleeding were found in the cardia and in the       gastric fundus. They were seen best on treroflexion and with stomach       partially deflated. There were no stigmata of recent bleeding.      The examined duodenum was normal.      Moderate portal hypertensive gastropathy was found in the entire       examined stomach. It was friable with fresh blood that cleared with       irrigation. Impression:               - Grade III esophageal varices.                           - Type 2 gastroesophageal varices (GOV2, esophageal  varices which extend along the fundus), without                            bleeding.                           - Normal examined duodenum.                           - Portal hypertensive gastropathy.                           - No specimens collected. Moderate Sedation:      MAC sedation used Recommendation:           - Diabetic (ADA) diet and low sodium diet.                           - Discontinue octreotide                           Continue twice daily H2RA (reported interaction                            between her HCV therapy and PPI)                           If blood pressure and pulse tolerate, patient                            should be  on nadolol 20 mg once daily at discharge.                           - Medication reconciliation was performed, and a                            list of the patient's discharge medications was                            provided to the patient. Procedure Code(s):        --- Professional ---                           [phone], Esophagogastroduodenoscopy, flexible,                            transoral; diagnostic, including collection of                            specimen(s) by brushing or washing, when performed                            (separate procedure) Diagnosis Code(s):        --- Professional ---                           I85.00, Esophageal varices without bleeding  I86.4, Gastric varices                           K92.0, Hematemesis                           K76.6, Portal hypertension                           K31.89, Other diseases of stomach and duodenum CPT copyright 2016 [employer]. All rights reserved. The codes documented in this report are preliminary and upon coder review may  be revised to meet current compliance requirements. Stacy Richard L. Stacy Carrow, MD 01/25/2016 3:41:08 PM This report has been signed electronically. Number of Addenda: 0

## 2016-01-25 NOTE — Transfer of Care (Signed)
Immediate Anesthesia Transfer of Care Note  Patient: Stacy Richard  Procedure(s) Performed: Procedure(s): ESOPHAGOGASTRODUODENOSCOPY (EGD) (N/A)  Patient Location: PACU  Anesthesia Type:MAC  Level of Consciousness: awake, alert  and oriented  Airway & Oxygen Therapy: Patient Spontanous Breathing and Patient connected to nasal cannula oxygen  Post-op Assessment: Report given to RN and Post -op Vital signs reviewed and stable  Post vital signs: Reviewed and stable  Last Vitals:  Filed Vitals:   01/25/16 1200 01/25/16 1349  BP:  121/58  Pulse:  76  Temp: 37.1 C 36.6 C  Resp:  26    Last Pain: There were no vitals filed for this visit.       Complications: No apparent anesthesia complications

## 2016-01-25 NOTE — Progress Notes (Signed)
PT Cancellation Note  Patient Details Name: Stacy Richard MRN: [id number] DOB: [birth year]   Cancelled Treatment:    Reason Eval/Treat Not Completed: Patient at procedure or test/unavailable   Weston Anna, MPT Pager: [phone]

## 2016-01-25 NOTE — Interval H&P Note (Signed)
History and Physical Interval Note:  01/25/2016 2:48 PM  Stacy Richard  has presented today for surgery, with the diagnosis of gi bleed  The various methods of treatment have been discussed with the patient and family. After consideration of risks, benefits and other options for treatment, the patient has consented to  Procedure(s): ESOPHAGOGASTRODUODENOSCOPY (EGD) (N/A) as a surgical intervention .  The patient's history has been reviewed, patient examined, no change in status, stable for surgery.  I have reviewed the patient's chart and labs.  Questions were answered to the patient's satisfaction.     Nelida Meuse III

## 2016-01-25 NOTE — Consult Note (Signed)
Consultation  Referring Provider: [facility] hospital[facility] Primary Care Physician:  Chesley Noon, MD Primary Gastroenterologist:  Roosevelt Locks NP [facility]  Reason for Consultation:   GI bleed  HPI: Stacy Richard is a 70 y.o. female who was admitted last evening through the emergency room after she developed acute onset of hematemesis at about 10:00 last night. She says she vomited black coffee-ground material 3 times at home. She then came on to the emergency room because she felt weak and lightheaded. She had not noted any melena or hematochezia over the past few days. She denies any abdominal pain. Patient does have history of hepatitis C/cirrhosis and is being followed at the [facility] hepatology clinic in [place] and just started treatment with Harvoni, and [facility]. She had an admission in December 2016 with a similar GI bleed. She was seen by [facility] at that time and underwent upper endoscopy which showed small distal varices with no stigmata of bleeding and mild portal gastropathy. She also had colonoscopy which was a normal exam. She did not require transfusion during that admission. She has not been on any aspirin or NSAIDs at home and denies any EtOH. Hemoglobin was 13 on admission and hemoglobin 10.8 today. INR 1.37. Patient has been hemodynamically stable since arrival to the emergency room. She's been placed on Rocephin, IV Pepcid, and octreotide. She has not had any further hematemesis , did have 1 black stool today.   Past Medical History  Diagnosis Date  . GERD (gastroesophageal reflux disease)   . Allergy     Rhinitis  . Hepatitis C   . Osteopenia   . NIDDM (non-insulin dependent diabetes mellitus)   . Osteoarthritis of knee     right knee  . Hypertension   . Arthritis     left hip  . Hepatic cirrhosis [facility])     Past Surgical History  Procedure Laterality Date  . Knee surgery    . Transthoracic echocardiogram  09/2012    EF 55-54%, mild  LVH & mild conc hypertrophy, grade 1 diastolic dysfunction; mild MR  . Nm myocar perf wall motion  09/2012    lexiscan myoview; normal study, low risk  . Esophagogastroduodenoscopy (egd) with propofol N/A 08/17/2015    Procedure: ESOPHAGOGASTRODUODENOSCOPY (EGD) WITH PROPOFOL;  Surgeon: Clarene Essex, MD;  Location: [facility] ENDOSCOPY;  Service: Endoscopy;  Laterality: N/A;  . Esophageal banding N/A 08/17/2015    Procedure: ESOPHAGEAL BANDING;  Surgeon: Clarene Essex, MD;  Location: [facility] ENDOSCOPY;  Service: Endoscopy;  Laterality: N/A;  ? banding  . Colonoscopy with propofol Left 08/21/2015    Procedure: COLONOSCOPY WITH PROPOFOL;  Surgeon: Teena Irani, MD;  Location: [facility] ENDOSCOPY;  Service: Endoscopy;  Laterality: Left;    Prior to Admission medications   Medication Sig Start Date End Date Taking? Authorizing Provider  Artificial Tear Ointment (ARTIFICIAL TEARS) ointment Place 1 application into both eyes as needed.    Yes Historical Provider, MD  busPIRone (BUSPAR) 7.5 MG tablet Take 1 tablet (7.5 mg total) by mouth daily. 01/03/16  Yes Merian Capron, MD  Calcium Carb-Cholecalciferol (CALTRATE 600+D) 600-800 MG-UNIT TABS Take 1 tablet by mouth 2 (two) times daily.   Yes Historical Provider, MD  calcium carbonate (TUMS - DOSED IN MG ELEMENTAL CALCIUM) 500 MG chewable tablet Chew 2 tablets by mouth 3 (three) times daily as needed for indigestion or heartburn.   Yes Historical Provider, MD  DULoxetine (CYMBALTA) 60 MG capsule Take 1 capsule (60 mg total) by mouth daily.  01/03/16  Yes Merian Capron, MD  erythromycin ophthalmic ointment Place 1 application into both eyes at bedtime.   Yes Historical Provider, MD  lactulose (CHRONULAC) 10 GM/15ML solution Take 10 g by mouth 2 (two) times daily.   Yes Historical Provider, MD  Ledipasvir-Sofosbuvir (HARVONI) 90-400 MG TABS Take 1 tablet by mouth daily. 01/08/16  Yes Historical Provider, MD  loratadine (CLARITIN) 10 MG tablet Take 10 mg by mouth daily.   Yes Historical  Provider, MD  metoprolol succinate (TOPROL XL) 50 MG 24 hr tablet Take 50 mg by mouth daily.  01/22/16 01/21/17 Yes Historical Provider, MD  Multiple Vitamin (MULTIVITAMIN WITH MINERALS) TABS tablet Take 1 tablet by mouth daily. Multivitamin with lutein and lycopene   Yes Historical Provider, MD  oxybutynin (DITROPAN-XL) 5 MG 24 hr tablet Take 5 mg by mouth daily.   Yes Historical Provider, MD  Polyethyl Glycol-Propyl Glycol (SYSTANE OP) Apply 1 drop to eye daily as needed (dry eye).    Yes Historical Provider, MD  ribavirin (REBETOL) 200 MG capsule Take 200 mg by mouth 2 (two) times daily.   Yes Historical Provider, MD    Current Facility-Administered Medications  Medication Dose Route Frequency Provider Last Rate Last Dose  . 0.9 %  sodium chloride infusion   Intravenous Continuous Ivor Costa, MD 75 mL/hr at 01/25/16 0259    . antiseptic oral rinse (CPC / CETYLPYRIDINIUM CHLORIDE 0.05%) solution 7 mL  7 mL Mouth Rinse BID Ivor Costa, MD   7 mL at 01/25/16 1045  . artificial tears (LACRILUBE) ophthalmic ointment 1 application  1 application Both Eyes PRN Ivor Costa, MD      . busPIRone (BUSPAR) tablet 7.5 mg  7.5 mg Oral Daily Ivor Costa, MD   7.5 mg at 01/25/16 1059  . calcium carbonate (TUMS - dosed in mg elemental calcium) chewable tablet 400 mg of elemental calcium  2 tablet Oral TID PRN Ivor Costa, MD      . calcium-vitamin D (OSCAL WITH D) 500-200 MG-UNIT per tablet 1 tablet  1 tablet Oral BID Ivor Costa, MD   1 tablet at 01/25/16 [id number]  . cefTRIAXone (ROCEPHIN) 2 g in dextrose 5 % 50 mL IVPB  2 g Intravenous Q24H Ivor Costa, MD      . DULoxetine (CYMBALTA) DR capsule 60 mg  60 mg Oral Daily Ivor Costa, MD   60 mg at 01/25/16 1008  . erythromycin ophthalmic ointment 1 application  1 application Both Eyes QHS Ivor Costa, MD   1 application at [phone]  . famotidine (PEPCID) IVPB 20 mg premix  20 mg Intravenous Q12H Ivor Costa, MD 100 mL/hr at 01/25/16 1008 20 mg at 01/25/16 1008  . insulin aspart  (novoLOG) injection 0-5 Units  0-5 Units Subcutaneous QHS Ivor Costa, MD      . insulin aspart (novoLOG) injection 0-9 Units  0-9 Units Subcutaneous TID WC Ivor Costa, MD   2 Units at 01/25/16 [phone]  . lactulose (CHRONULAC) 10 GM/15ML solution 10 g  10 g Oral BID Ivor Costa, MD   10 g at 01/25/16 1007  . Ledipasvir-Sofosbuvir 90-400 MG TABS 1 tablet  1 tablet Oral Daily Ivor Costa, MD      . loratadine (CLARITIN) tablet 10 mg  10 mg Oral Daily Ivor Costa, MD   10 mg at 01/25/16 1008  . metoprolol succinate (TOPROL-XL) 24 hr tablet 50 mg  50 mg Oral Daily Ivor Costa, MD   50 mg at 01/25/16 1059  . multivitamin  with minerals tablet 1 tablet  1 tablet Oral Daily Ivor Costa, MD   1 tablet at 01/25/16 1039  . octreotide (SANDOSTATIN) 500 mcg in sodium chloride 0.9 % 250 mL (2 mcg/mL) infusion  50 mcg/hr Intravenous Continuous Everlene Balls, MD 25 mL/hr at 01/25/16 1014 50 mcg/hr at 01/25/16 1014  . ondansetron (ZOFRAN) injection 4 mg  4 mg Intravenous Q8H PRN Ivor Costa, MD      . oxybutynin (DITROPAN-XL) 24 hr tablet 5 mg  5 mg Oral Daily Ivor Costa, MD   5 mg at 01/25/16 1008  . polyvinyl alcohol (LIQUIFILM TEARS) 1.4 % ophthalmic solution 1 drop  1 drop Both Eyes Daily PRN Ivor Costa, MD      . ribavirin (REBETOL) capsule 200 mg  200 mg Oral BID Ivor Costa, MD      . sodium chloride flush (NS) 0.9 % injection 3 mL  3 mL Intravenous Q12H Ivor Costa, MD   3 mL at 01/25/16 1042    Allergies as of 01/24/2016  . (No Known Allergies)    Family History  Problem Relation Age of Onset  . Heart failure Mother   . Hepatitis Mother   . Kidney cancer Mother   . Heart disease Father   . Heart Problems Sister     murmur  . Diabetes type II Sister   . Heart Problems Daughter     murmur  . Bipolar disorder Daughter     Social History   Social History  . Marital Status: Widowed    Spouse Name: N/A  . Number of Children: 4  . Years of Education: N/A   Occupational History  . Not on file.   Social History  Main Topics  . Smoking status: Former Smoker    Quit date: 10/28/2009  . Smokeless tobacco: Never Used  . Alcohol Use: No  . Drug Use: Yes    Special: Marijuana  . Sexual Activity: Not Currently   Other Topics Concern  . Not on file   Social History Narrative    Review of Systems: Pertinent positive and negative review of systems were noted in the above HPI section.  All other review of systems was otherwise negative.  Physical Exam: Vital signs in last 24 hours: Temp:  [97.1 F (36.2 C)-98.7 F (37.1 C)] 98.7 F (37.1 C) (05/18 0800) Pulse Rate:  [75-92] 80 (05/18 0800) Resp:  [14-23] 22 (05/18 0800) BP: (101-156)/(50-93) 110/66 mmHg (05/18 0800) SpO2:  [97 %-100 %] 99 % (05/18 0800) Weight:  [170 lb 3.1 oz (77.2 kg)] 170 lb 3.1 oz (77.2 kg) (05/18 0254) Last BM Date: 01/24/16 General:   Alert,  Well-developed, well-nourished,older female  pleasant and cooperative in NAD Head:  Normocephalic and atraumatic. Eyes:  Sclera clear, no icterus.   Conjunctiva pink. Ears:  Normal auditory acuity. Nose:  No deformity, discharge,  or lesions. Mouth:  No deformity or lesions.   Neck:  Supple; no masses or thyromegaly. Lungs:  Clear throughout to auscultation.   No wheezes, crackles, or rhonchi. Heart:  Regular rate and rhythm; no murmurs, clicks, rubs,  or gallops. Abdomen:  Soft,nontender, BS active,nonpalp mass or hsm, no appreciable fluid wave.   Rectal:  Deferred  Msk:  Symmetrical without gross deformities. . Pulses:  Normal pulses noted. Extremities:  Without clubbing or edema. Neurologic:  Alert and  oriented x4;  grossly normal neurologically. No asterixis Skin:  Intact without significant lesions or rashes.. Psych:  Alert and cooperative. Normal mood and affect.  Intake/Output from previous day: 05/17 0701 - 05/18 0700 In: 414.6 [I.V.:364.6; IV Piggyback:50] Out: -  Intake/Output this shift: Total I/O In: 100 [I.V.:100] Out: -   Lab Results:  Recent Labs   01/25/16 0020 01/25/16 0207 01/25/16 0739  WBC 12.8* 12.3* 11.2*  HGB 13.1 11.0* 10.8*  HCT 40.4 32.7* 32.7*  PLT 138* 118* 105*   BMET  Recent Labs  01/24/16 2356 01/25/16 0316  NA 140 139  K 3.6 3.8  CL 102 106  CO2 27 24  GLUCOSE 184* 147*  BUN 35* 30*  CREATININE 1.15* 0.93  CALCIUM 9.5 8.3*   LFT  Recent Labs  01/25/16 0316  PROT 7.6  ALBUMIN 2.8*  AST 37  ALT 26  ALKPHOS 106  BILITOT 2.2*   PT/INR  Recent Labs  01/24/16 2356  LABPROT 16.9*  INR 1.37     IMPRESSION:  #59   70 year old female admitted with hematemesis/coffee ground emesis in setting of known hep C/cirrhosis and prior history of acute upper GI bleed felt secondary to portal gastropathy. She does have distal esophageal varices which were felt to be small at the time of last EGD December 2016. Patient has not had any evidence of ongoing hemorrhage since admission, feel variceal bleed less likely, and more likely secondary to portal gastropathy. #2 mild anemia secondary to above #3 history of hypertension-no blood pressures recently have been on the low side next line #4 coronary artery disease #5 adult-onset diabetes mellitus  PLAN: Patient is scheduled for upper endoscopy this afternoon with Dr. Loletha Carrow. Procedure discussed in detail with patient including risks and benefits and she is agreeable to proceed. Continue octreotide and IV Pepcid for now pending EGD findings Serial hemoglobins and transfuse for hemoglobin 7 or less Rocephin was started prophylactically It appears that she has not been on a beta blocker at home, does have Toprol on her med list but unclear according to recent outpatient cardiology evaluation with Dr. Debara Pickett that she's actually been taking the Toprol. She would benefit from a nonselective beta blocker and would plan to switch her to nadolol on discharge  Thank you for consult we will follow with you    ( did not intentionally underline , and cannot seem to  delete)  [name]  01/25/2016, 12:26 PM

## 2016-01-25 NOTE — Anesthesia Postprocedure Evaluation (Addendum)
Anesthesia Post Note  Patient: Stacy Richard  Procedure(s) Performed: Procedure(s) (LRB): ESOPHAGOGASTRODUODENOSCOPY (EGD) (N/A)  Patient location during evaluation: PACU Anesthesia Type: MAC Level of consciousness: awake and alert and oriented Pain management: pain level controlled Vital Signs Assessment: post-procedure vital signs reviewed and stable Respiratory status: spontaneous breathing, nonlabored ventilation and respiratory function stable Cardiovascular status: blood pressure returned to baseline and stable Postop Assessment: no signs of nausea or vomiting Anesthetic complications: no    Last Vitals:  Filed Vitals:   01/25/16 1540 01/25/16 1550  BP: 161/77   Pulse: 68 68  Temp: 36.8 C   Resp: 25 20    Last Pain: There were no vitals filed for this visit.               Guenther Dunshee A.

## 2016-01-26 ENCOUNTER — Encounter (HOSPITAL_COMMUNITY): Payer: Self-pay | Admitting: Gastroenterology

## 2016-01-26 DIAGNOSIS — D62 Acute posthemorrhagic anemia: Secondary | ICD-10-CM | POA: Insufficient documentation

## 2016-01-26 DIAGNOSIS — E119 Type 2 diabetes mellitus without complications: Secondary | ICD-10-CM

## 2016-01-26 DIAGNOSIS — I8510 Secondary esophageal varices without bleeding: Secondary | ICD-10-CM

## 2016-01-26 DIAGNOSIS — R110 Nausea: Secondary | ICD-10-CM

## 2016-01-26 DIAGNOSIS — D72829 Elevated white blood cell count, unspecified: Secondary | ICD-10-CM

## 2016-01-26 DIAGNOSIS — K219 Gastro-esophageal reflux disease without esophagitis: Secondary | ICD-10-CM

## 2016-01-26 DIAGNOSIS — K7460 Unspecified cirrhosis of liver: Secondary | ICD-10-CM

## 2016-01-26 LAB — GLUCOSE, CAPILLARY
GLUCOSE-CAPILLARY: 130 mg/dL — AB (ref 65–99)
GLUCOSE-CAPILLARY: 125 mg/dL — AB (ref 65–99)
Glucose-Capillary: 129 mg/dL — ABNORMAL HIGH (ref 65–99)
Glucose-Capillary: 186 mg/dL — ABNORMAL HIGH (ref 65–99)

## 2016-01-26 LAB — CBC
HCT: 22.5 % — ABNORMAL LOW (ref 36.0–46.0)
HCT: 32.3 % — ABNORMAL LOW (ref 36.0–46.0)
HEMOGLOBIN: 7.4 g/dL — AB (ref 12.0–15.0)
HEMOGLOBIN: 10.9 g/dL — AB (ref 12.0–15.0)
MCH: 30.1 pg (ref 26.0–34.0)
MCH: 30.8 pg (ref 26.0–34.0)
MCHC: 32.9 g/dL (ref 30.0–36.0)
MCHC: 33.7 g/dL (ref 30.0–36.0)
MCV: 91.5 fL (ref 78.0–100.0)
MCV: 91.2 fL (ref 78.0–100.0)
PLATELETS: 80 10*3/uL — AB (ref 150–400)
PLATELETS: 82 10*3/uL — AB (ref 150–400)
RBC: 2.46 MIL/uL — AB (ref 3.87–5.11)
RBC: 3.54 MIL/uL — AB (ref 3.87–5.11)
RDW: 15.4 % (ref 11.5–15.5)
RDW: 16.1 % — ABNORMAL HIGH (ref 11.5–15.5)
WBC: 6.1 10*3/uL (ref 4.0–10.5)
WBC: 6.4 10*3/uL (ref 4.0–10.5)

## 2016-01-26 LAB — BASIC METABOLIC PANEL
ANION GAP: 3 — AB (ref 5–15)
BUN: 21 mg/dL — ABNORMAL HIGH (ref 6–20)
CALCIUM: 6.8 mg/dL — AB (ref 8.9–10.3)
CO2: 25 mmol/L (ref 22–32)
CREATININE: 0.75 mg/dL (ref 0.44–1.00)
Chloride: 112 mmol/L — ABNORMAL HIGH (ref 101–111)
Glucose, Bld: 128 mg/dL — ABNORMAL HIGH (ref 65–99)
Potassium: 3.8 mmol/L (ref 3.5–5.1)
Sodium: 140 mmol/L (ref 135–145)

## 2016-01-26 LAB — HEMOGLOBIN AND HEMATOCRIT, BLOOD
HCT: 26.7 % — ABNORMAL LOW (ref 36.0–46.0)
HEMOGLOBIN: 8.7 g/dL — AB (ref 12.0–15.0)

## 2016-01-26 LAB — VITAMIN B12: VITAMIN B 12: 938 pg/mL — AB (ref 180–914)

## 2016-01-26 LAB — RETICULOCYTES
RBC.: 2.86 MIL/uL — ABNORMAL LOW (ref 3.87–5.11)
RETIC COUNT ABSOLUTE: 91.5 10*3/uL (ref 19.0–186.0)
RETIC CT PCT: 3.2 % — AB (ref 0.4–3.1)

## 2016-01-26 LAB — IRON AND TIBC
IRON: 187 ug/dL — AB (ref 28–170)
Saturation Ratios: 81 % — ABNORMAL HIGH (ref 10.4–31.8)
TIBC: 230 ug/dL — AB (ref 250–450)
UIBC: 43 ug/dL

## 2016-01-26 LAB — PREPARE RBC (CROSSMATCH)

## 2016-01-26 LAB — FOLATE: Folate: 15.9 ng/mL (ref >5.9)

## 2016-01-26 LAB — FERRITIN: FERRITIN: 169 ng/mL (ref 11–307)

## 2016-01-26 LAB — TROPONIN I: Troponin I: 0.03 ng/mL (ref <0.031)

## 2016-01-26 MED ORDER — SODIUM CHLORIDE 0.9 % IV SOLN
Freq: Once | INTRAVENOUS | Status: AC
  Administered 2016-01-26: 08:00:00 via INTRAVENOUS

## 2016-01-26 MED ORDER — SODIUM CHLORIDE 0.9 % IV BOLUS (SEPSIS)
500.0000 mL | Freq: Once | INTRAVENOUS | Status: AC
  Administered 2016-01-26: 500 mL via INTRAVENOUS

## 2016-01-26 MED ORDER — NADOLOL 20 MG PO TABS
20.0000 mg | ORAL_TABLET | Freq: Every day | ORAL | Status: DC
  Filled 2016-01-26 (×2): qty 1

## 2016-01-26 MED ORDER — SODIUM CHLORIDE 0.9 % IV SOLN
Freq: Once | INTRAVENOUS | Status: AC
  Administered 2016-01-26: 12:00:00 via INTRAVENOUS

## 2016-01-26 NOTE — Progress Notes (Signed)
PT Cancellation Note  Patient Details Name: Stacy Richard MRN: [id number] DOB: 1946-05-24   Cancelled Treatment:    Reason Eval/Treat Not Completed: Fatigue/lethargy limiting ability to participate Pt declined PT due to fatigue and feeling tired.  States she didn't get any sleep last night.  Pt to receive unit of PRBCs today as well.  Will check back as schedule permits.   [name],[name] 01/26/2016, 11:26 AM Carmelia Bake, PT, DPT 01/26/2016 Pager: [phone]

## 2016-01-26 NOTE — Progress Notes (Signed)
PROGRESS NOTE    Stacy Richard  [id number] DOB: [birth year] DOA: 01/24/2016  PCP: Chesley Noon, MD    Brief Narrative:  Stacy Richard is a 70 y.o. female with medical history significant of hepatitis C cirrhosis, esophageal varice, hypertension, diabetes, GERD, CAD, depression, arthritis, who presents with coffee-ground emesis. She had 3 episodes of coffee ground emesis on the day of admission.   Subjective: No complaints of nausea, vomiting, abdominal pain, diarrhea. Discussed EGD findings and need for transfusion.   Assessment & Plan:   Principal Problem:   Upper GI bleed - 12/16- EGD showing distal small esophageal varices and portal gastropathy - prior Colonscopy unrevealing - cont IV Pepcid (cannot use PPI due to hep C treatment) and Octreotide - have called GI-  EGD shows esophageal varices- see report below. -GI recommends bid H2 blocker- she is already on Metoprolol which is titrated to HR in 60s.  Active Problems:  GERD - pepcid IV  Renal insuficiency - improved with IVF  Anemia -acute blood loss- will give 1 U PRBC today - has underlygin anemia of chronic disease  Thrombocytopenia - chronic    Hepatitis C - Harvoni and Ribavirin started 5/3 - cont Lactulose    Type 2 diabetes mellitus  - ISS while here - normally diet controlled with HbA1c of 6.4  Depression/ anxiety - Cymbalta, Buspar  HTN - Metoprolol   DVT prophylaxis: SCDs Code Status: full code Family Communication:  Disposition Plan: 2-3 day stay expected Consultants:   GI Procedures:  Grade III varices were found in the middle third of the esophagus and in the lower third of the esophagus.  Type 2 gastroesophageal varices (GOV2, esophageal varices which extend   along the fundus) with no bleeding were found in the cardia and in the   gastric fundus. They were seen best on treroflexion and with stomach   partially deflated. There were no stigmata of recent  bleeding.  The examined duodenum was normal.  Moderate portal hypertensive gastropathy was found in the entire   examined stomach. It was friable with fresh blood that cleared with   irrigation. Antimicrobials:  Anti-infectives    Start     Dose/Rate Route Frequency Ordered Stop   01/25/16 2200  cefTRIAXone (ROCEPHIN) 2 g in dextrose 5 % 50 mL IVPB  Status:  Discontinued     2 g 100 mL/hr over 30 Minutes Intravenous Every 24 hours 01/25/16 0201 01/26/16 0721   01/25/16 1700  ribavirin (REBETOL) capsule 600 mg     600 mg Oral Daily with breakfast 01/25/16 1615     01/25/16 1000  Ledipasvir-Sofosbuvir 90-400 MG TABS 1 tablet     1 tablet Oral Daily 01/25/16 0201     01/25/16 1000  ribavirin (REBETOL) capsule 200 mg  Status:  Discontinued     200 mg Oral 2 times daily 01/25/16 0201 01/25/16 1615   01/25/16 0000  cefTRIAXone (ROCEPHIN) 2 g in dextrose 5 % 50 mL IVPB     2 g 100 mL/hr over 30 Minutes Intravenous  Once 01/24/16 2359 01/25/16 0130       Objective: Filed Vitals:   01/26/16 0900 01/26/16 1000 01/26/16 1100 01/26/16 1200  BP:  114/67 106/43 82/43  Pulse: 69 73 71 72  Temp:      TempSrc:      Resp: 22 28 23 30   Height:      Weight:      SpO2: 96% 97% 97% 95%    Intake/Output Summary (Last  24 hours) at 01/26/16 1300 Last data filed at 01/26/16 0500  Gross per 24 hour  Intake 1200.42 ml  Output    650 ml  Net 550.42 ml   Filed Weights   01/25/16 0254  Weight: 77.2 kg (170 lb 3.1 oz)    Examination: General exam: Appears comfortable  HEENT: PERRLA, oral mucosa moist, no sclera icterus or thrush Respiratory system: Clear to auscultation. Respiratory effort normal. Cardiovascular system: S1 & S2 heard, RRR.  No murmurs  Gastrointestinal system: Abdomen soft, non-tender, nondistended. Normal bowel sound. No organomegaly Central nervous system: Alert and oriented. No focal neurological deficits. Extremities: No cyanosis, clubbing or  edema Skin: No rashes or ulcers Psychiatry:  Mood & affect appropriate.     Data Reviewed: I have personally reviewed following labs and imaging studies  CBC:  Recent Labs Lab 01/25/16 0020 01/25/16 0207 01/25/16 0739 01/25/16 1609 01/25/16 2204 01/26/16 0135 01/26/16 0506  WBC 12.8* 12.3* 11.2*  --   --   --  6.1  HGB 13.1 11.0* 10.8* 9.6* 8.4* 8.7* 7.4*  HCT 40.4 32.7* 32.7* 29.2* 25.6* 26.7* 22.5*  MCV 92.4 91.3 92.1  --   --   --  91.5  PLT 138* 118* 105*  --   --   --  80*   Basic Metabolic Panel:  Recent Labs Lab 01/24/16 2356 01/25/16 0316 01/26/16 0506  NA 140 139 140  K 3.6 3.8 3.8  CL 102 106 112*  CO2 27 24 25   GLUCOSE 184* 147* 128*  BUN 35* 30* 21*  CREATININE 1.15* 0.93 0.75  CALCIUM 9.5 8.3* 6.8*   GFR: Estimated Creatinine Clearance: 60.4 mL/min (by C-G formula based on Cr of 0.75). Liver Function Tests:  Recent Labs Lab 01/24/16 2356 01/25/16 0316  AST 42* 37  ALT 27 26  ALKPHOS 117 106  BILITOT 2.9* 2.2*  PROT 8.2* 7.6  ALBUMIN 3.1* 2.8*   No results for input(s): LIPASE, AMYLASE in the last 168 hours. No results for input(s): AMMONIA in the last 168 hours. Coagulation Profile:  Recent Labs Lab 01/24/16 2356  INR 1.37   Cardiac Enzymes:  Recent Labs Lab 01/25/16 2127 01/26/16 0135  TROPONINI <0.03 0.03   BNP (last 3 results) No results for input(s): PROBNP in the last 8760 hours. HbA1C: No results for input(s): HGBA1C in the last 72 hours. CBG:  Recent Labs Lab 01/25/16 1216 01/25/16 1648 01/25/16 2153 01/26/16 0718 01/26/16 1226  GLUCAP 140* 129* 121* 129* 186*   Lipid Profile: No results for input(s): CHOL, HDL, LDLCALC, TRIG, CHOLHDL, LDLDIRECT in the last 72 hours. Thyroid Function Tests: No results for input(s): TSH, T4TOTAL, FREET4, T3FREE, THYROIDAB in the last 72 hours. Anemia Panel:  Recent Labs  01/26/16 0824  VITAMINB12 938*  FOLATE 15.9  FERRITIN 169  TIBC 230*  IRON 187*  RETICCTPCT  3.2*   Urine analysis: No results found for: COLORURINE, APPEARANCEUR, LABSPEC, PHURINE, GLUCOSEU, HGBUR, BILIRUBINUR, KETONESUR, PROTEINUR, UROBILINOGEN, NITRITE, LEUKOCYTESUR Sepsis Labs: @LABRCNTIP (procalcitonin:4,lacticidven:4) Recent Results (from the past 240 hour(s))  MRSA PCR Screening     Status: None   Collection Time: 01/25/16  2:52 AM  Result Value Ref Range Status   MRSA by PCR NEGATIVE NEGATIVE Final    Comment:        The GeneXpert MRSA Assay (FDA approved for NASAL specimens only), is one component of a comprehensive MRSA colonization surveillance program. It is not intended to diagnose MRSA infection nor to guide or monitor treatment for MRSA infections.  Radiology Studies: No results found.  Scheduled Meds: . antiseptic oral rinse  7 mL Mouth Rinse BID  . busPIRone  7.5 mg Oral Daily  . calcium-vitamin D  1 tablet Oral BID  . DULoxetine  60 mg Oral Daily  . erythromycin  1 application Both Eyes QHS  . famotidine  40 mg Oral BID  . insulin aspart  0-5 Units Subcutaneous QHS  . insulin aspart  0-9 Units Subcutaneous TID WC  . lactulose  10 g Oral BID  . Ledipasvir-Sofosbuvir  1 tablet Oral Daily  . loratadine  10 mg Oral Daily  . multivitamin with minerals  1 tablet Oral Daily  . nadolol  20 mg Oral Daily  . oxybutynin  5 mg Oral Daily  . ribavirin  600 mg Oral Q breakfast  . sodium chloride flush  3 mL Intravenous Q12H   Continuous Infusions:     LOS: 1 day    Time spent in minutes: 54    [place], MD [facility] Pager: [email or web address] Password [email or web address] 01/26/2016, 1:00 PM

## 2016-01-26 NOTE — Progress Notes (Signed)
Patient ID: Stacy Richard, female   DOB: 1946/08/01, 70 y.o.   MRN: [id number]    Progress Note   Subjective   Feels Ok- no c/o abdominal pain, nausea- no further coffee ground emesis, no melena Hgb down to 7.4 this am - to be transfused one unit    Objective   Vital signs in last 24 hours: Temp:  [97.8 F (36.6 C)-99.1 F (37.3 C)] 99.1 F (37.3 C) (05/19 0800) Pulse Rate:  [64-94] 67 (05/19 0618) Resp:  [17-36] 22 (05/19 0618) BP: (68-161)/(22-78) 105/53 mmHg (05/19 0618) SpO2:  [96 %-100 %] 98 % (05/19 0618) Last BM Date: 01/24/16 General:    white female in NAD, in good spirits Heart:  Regular rate and rhythm; no murmurs Lungs: Respirations even and unlabored, lungs CTA bilaterally Abdomen:  Soft, nontender and nondistended. Normal bowel sounds. Extremities:  Without edema. Neurologic:  Alert and oriented,  grossly normal neurologically. Psych:  Cooperative. Normal mood and affect.  Intake/Output from previous day: 05/18 0701 - 05/19 0700 In: 1300.4 [I.V.:750.4; IV Piggyback:550] Out: 650 [Urine:650] Intake/Output this shift:    Lab Results:  Recent Labs  01/25/16 0207 01/25/16 0739  01/25/16 2204 01/26/16 0135 01/26/16 0506  WBC 12.3* 11.2*  --   --   --  6.1  HGB 11.0* 10.8*  < > 8.4* 8.7* 7.4*  HCT 32.7* 32.7*  < > 25.6* 26.7* 22.5*  PLT 118* 105*  --   --   --  80*  < > = values in this interval not displayed. BMET  Recent Labs  01/24/16 2356 01/25/16 0316 01/26/16 0506  NA 140 139 140  K 3.6 3.8 3.8  CL 102 106 112*  CO2 27 24 25   GLUCOSE 184* 147* 128*  BUN 35* 30* 21*  CREATININE 1.15* 0.93 0.75  CALCIUM 9.5 8.3* 6.8*   LFT  Recent Labs  01/25/16 0316  PROT 7.6  ALBUMIN 2.8*  AST 37  ALT 26  ALKPHOS 106  BILITOT 2.2*   PT/INR  Recent Labs  01/24/16 2356  LABPROT 16.9*  INR 1.37        Assessment / Plan:    #1 70 yo female with Hep C cirrhosis admitted with hematemesis EGD yesterday with finding of grade 3 varices-  no stigmata of bleeding , and portal gastropathy Bleeding felt secondary to portal gastropathy To start nadolol 20 mg daily, and stop toprol  #2 anemia- secondary to blood loss- continues to drift- transfuse one unit ,follow hgb, and keep hgb between 7 and 8  Pt is followed by [facility]- will discuss with Dr Loletha Carrow regarding accepting  pt  by [facility] as well as they do not see  inpatients in [place]  Pt can transfer to reg bed  Principal Problem:   Upper GI bleed Active Problems:   GERD (gastroesophageal reflux disease)   Hepatitis C   Type 2 diabetes mellitus (HCC)   Osteoarthritis of knee   HTN (hypertension)   CAD (coronary artery disease)   Hepatic cirrhosis ([place])   GIB (gastrointestinal bleeding)   Leukocytosis   Hematemesis     LOS: 1 day   [name]  01/26/2016, 9:06 AM  I have reviewed the entire case in detail with the above APP and discussed the plan in detail.  Therefore, I agree with the diagnoses recorded above. In addition,  I have personally interviewed and examined the patient and have personally reviewed any abdominal/pelvic CT scan images.  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  There is no brisk GI bleeding.  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  Gastric and esophageal varices were found, but were not the source of the acute bleeding. She will be on non-selective beta blocker for primary prophylaxis.    Nelida Meuse III Pager [phone]  Mon-Fri 8a-5p [phone] after 5p, weekends, holidays

## 2016-01-26 NOTE — Clinical Documentation Improvement (Signed)
Hospitalist Please update your documentation within the medical record to reflect your response to this query. Thank you  Can the diagnosis of "anemia - secondary to blood loss"be further specified with type of "blood loss"?   Acute Blood Loss Anemia   Chronic Blood Loss Anemia  Iron deficiency Anemia  Nutritional anemia, including the nutrition or mineral deficits  Chronic Anemia, including the suspected or known cause  Anemia of chronic disease, including the associated chronic disease state  Other  Clinically Undetermined  Document any associated diagnoses/conditions.  Supporting Information: 01/26/16 progr note..."anemia- secondary to blood loss- continues to drift- transfuse one unit ,follow hgb, and keep hgb between 7 and 8"...  Please exercise your independent, professional judgment when responding. A specific answer is not anticipated or expected.  Thank You,  Ermelinda Das, RN, BSN, [place] Certified Clinical Documentation Specialist [place]: Health Information Management [phone]

## 2016-01-26 NOTE — Progress Notes (Signed)
OT Cancellation Note  Patient Details Name: Stacy Richard MRN: [id number] DOB: June 24, 1946   Cancelled Treatment:    Reason Eval/Treat Not Completed: Fatigue/lethargy limiting ability to participate. Pt declined OT due to fatigue; note to receive unit of PRBCs today as well. Will check back as schedule permits.  Clois Montavon A 01/26/2016, 11:52 AM

## 2016-01-26 NOTE — Progress Notes (Signed)
Pt transferred from ICU to room 1518. Pt AO x 4. Pt made aware of unit rules and regulations. Pt informed to use call bell to call for assistance. No questions or concerns from the pt at this time.  Fusako Tanabe W Calandra Madura, RN

## 2016-01-27 DIAGNOSIS — I10 Essential (primary) hypertension: Secondary | ICD-10-CM

## 2016-01-27 DIAGNOSIS — I8501 Esophageal varices with bleeding: Principal | ICD-10-CM

## 2016-01-27 LAB — CBC
HCT: 30.1 % — ABNORMAL LOW (ref 36.0–46.0)
HEMOGLOBIN: 10.1 g/dL — AB (ref 12.0–15.0)
MCH: 30.4 pg (ref 26.0–34.0)
MCHC: 33.6 g/dL (ref 30.0–36.0)
MCV: 90.7 fL (ref 78.0–100.0)
PLATELETS: 77 10*3/uL — AB (ref 150–400)
RBC: 3.32 MIL/uL — AB (ref 3.87–5.11)
RDW: 15.8 % — ABNORMAL HIGH (ref 11.5–15.5)
WBC: 5.6 10*3/uL (ref 4.0–10.5)

## 2016-01-27 LAB — BASIC METABOLIC PANEL
Anion gap: 2 — ABNORMAL LOW (ref 5–15)
BUN: 16 mg/dL (ref 6–20)
CHLORIDE: 108 mmol/L (ref 101–111)
CO2: 26 mmol/L (ref 22–32)
Calcium: 7.5 mg/dL — ABNORMAL LOW (ref 8.9–10.3)
Creatinine, Ser: 0.69 mg/dL (ref 0.44–1.00)
Glucose, Bld: 139 mg/dL — ABNORMAL HIGH (ref 65–99)
POTASSIUM: 3.2 mmol/L — AB (ref 3.5–5.1)
SODIUM: 136 mmol/L (ref 135–145)

## 2016-01-27 LAB — GLUCOSE, CAPILLARY: GLUCOSE-CAPILLARY: 138 mg/dL — AB (ref 65–99)

## 2016-01-27 MED ORDER — NADOLOL 20 MG PO TABS: 20.0000 mg | ORAL_TABLET | Freq: Every day | ORAL | Status: DC

## 2016-01-27 MED ORDER — POTASSIUM CHLORIDE CRYS ER 20 MEQ PO TBCR
40.0000 meq | EXTENDED_RELEASE_TABLET | Freq: Once | ORAL | Status: AC
  Administered 2016-01-27: 40 meq via ORAL
  Filled 2016-01-27: qty 2

## 2016-01-27 MED ORDER — FAMOTIDINE 40 MG PO TABS: 40.0000 mg | ORAL_TABLET | Freq: Two times a day (BID) | ORAL | Status: AC | PRN

## 2016-01-27 NOTE — Discharge Summary (Signed)
Physician Discharge Summary  Stacy Richard [id number] DOB: Aug 25, 1946 DOA: 01/24/2016  PCP: Chesley Noon, MD  Admit date: 01/24/2016 Discharge date: 01/27/2016  Time spent: 55 minutes  Recommendations for Outpatient Follow-up:  1. F/u CBC  Discharge Condition: stable    Discharge Diagnoses:  Principal Problem:   Bleeding esophageal varices (HCC) Active Problems:   GERD (gastroesophageal reflux disease)   Hepatitis C   Type 2 diabetes mellitus (HCC)   Osteoarthritis of knee   HTN (hypertension)   CAD (coronary artery disease)   Hepatic cirrhosis (HCC)   Leukocytosis   Acute blood loss anemia   History of present illness:  Stacy Richard is a 70 y.o. female with medical history significant of hepatitis C cirrhosis, esophageal varice, hypertension, diabetes, GERD, CAD, depression, arthritis, who presents with coffee-ground emesis. She had 3 episodes of coffee ground emesis on the day of admission.   Hospital Course:   Principal Problem:  Upper GI bleed - 12/16- EGD showing distal small esophageal varices and portal gastropathy - prior Colonscopy unrevealing - cont IV Pepcid (cannot use PPI due to hep C treatment) and Octreotide - have called GI- EGD shows esophageal varices- see report below. -GI recommends bid H2 blocker and Nadolol  Active Problems: GERD - pepcid    Hypokalemia - replaced  Renal insuficiency - improved with IVF  Anemia -acute blood loss- will given 1 U PRBC - has underlying anemia of chronic disease  Thrombocytopenia - chronic   Hepatitis C - Harvoni and Ribavirin started 5/3 - cont Lactulose   Type 2 diabetes mellitus  - ISS while here - normally diet controlled with HbA1c of 6.4  Depression/ anxiety - Cymbalta, Buspar  HTN - Metoprolol   Procedures:  EGD  Consultations:  GI  Discharge Exam: Filed Weights   01/25/16 0254  Weight: 77.2 kg (170 lb 3.1 oz)   Filed Vitals:   01/26/16 2104 01/27/16 0555  BP:  116/53 124/59  Pulse: 66 71  Temp: 98.3 F (36.8 C) 98.2 F (36.8 C)  Resp: 17 18    General: AAO x 3, no distress Cardiovascular: RRR, no murmurs  Respiratory: clear to auscultation bilaterally GI: soft, non-tender, non-distended, bowel sound positive  Discharge Instructions You were cared for by a hospitalist during your hospital stay. If you have any questions about your discharge medications or the care you received while you were in the hospital after you are discharged, you can call the unit and asked to speak with the hospitalist on call if the hospitalist that took care of you is not available. Once you are discharged, your primary care physician will handle any further medical issues. Please note that NO REFILLS for any discharge medications will be authorized once you are discharged, as it is imperative that you return to your primary care physician (or establish a relationship with a primary care physician if you do not have one) for your aftercare needs so that they can reassess your need for medications and monitor your lab values.      Discharge Instructions    Discharge instructions    Complete by:  As directed   Low sodium, low fat diet     Increase activity slowly    Complete by:  As directed             Medication List    STOP taking these medications        erythromycin ophthalmic ointment     TOPROL XL 50 MG 24 hr tablet  Generic drug:  metoprolol succinate      TAKE these medications        artificial tears ointment  Place 1 application into both eyes as needed.     busPIRone 7.5 MG tablet  Commonly known as:  BUSPAR  Take 1 tablet (7.5 mg total) by mouth daily.     calcium carbonate 500 MG chewable tablet  Commonly known as:  TUMS - dosed in mg elemental calcium  Chew 2 tablets by mouth 3 (three) times daily as needed for indigestion or heartburn.     CALTRATE 600+D 600-800 MG-UNIT Tabs  Generic drug:  Calcium Carb-Cholecalciferol  Take 1  tablet by mouth 2 (two) times daily.     DULoxetine 60 MG capsule  Commonly known as:  CYMBALTA  Take 1 capsule (60 mg total) by mouth daily.     famotidine 40 MG tablet  Commonly known as:  PEPCID  Take 1 tablet (40 mg total) by mouth 2 (two) times daily as needed for heartburn or indigestion.     HARVONI 90-400 MG Tabs  Generic drug:  Ledipasvir-Sofosbuvir  Take 1 tablet by mouth daily.     lactulose 10 GM/15ML solution  Commonly known as:  CHRONULAC  Take 10 g by mouth 2 (two) times daily.     loratadine 10 MG tablet  Commonly known as:  CLARITIN  Take 10 mg by mouth daily.     multivitamin with minerals Tabs tablet  Take 1 tablet by mouth daily. Multivitamin with lutein and lycopene     nadolol 20 MG tablet  Commonly known as:  CORGARD  Take 1 tablet (20 mg total) by mouth daily.     oxybutynin 5 MG 24 hr tablet  Commonly known as:  DITROPAN-XL  Take 5 mg by mouth daily.     ribavirin 200 MG capsule  Commonly known as:  REBETOL  Take 600 mg by mouth daily.     SYSTANE OP  Apply 1 drop to eye daily as needed (dry eye).       No Known Allergies Follow-up Information    Follow up with Doran Stabler, MD.   Specialty:  Gastroenterology   Why:  As needed, If symptoms worsen   Contact information:   [address] [place] [address] [phone]       Please follow up.   Why:  his office will call you with an appt       The results of significant diagnostics from this hospitalization (including imaging, microbiology, ancillary and laboratory) are listed below for reference.    Significant Diagnostic Studies: Mm Digital Screening Bilateral  01/08/2016  CLINICAL DATA:  Screening. EXAM: DIGITAL SCREENING BILATERAL MAMMOGRAM WITH CAD COMPARISON:  Previous exam(s). ACR Breast Density Category b: There are scattered areas of fibroglandular density. FINDINGS: There are no findings suspicious for malignancy. Images were processed with CAD. IMPRESSION:  No mammographic evidence of malignancy. A result letter of this screening mammogram will be mailed directly to the patient. RECOMMENDATION: Screening mammogram in one year. (Code:SM-B-01Y) BI-RADS CATEGORY  1: Negative. Electronically Signed   By: Altamese Cabal M.D.   On: 01/08/2016 16:42    Microbiology: Recent Results (from the past 240 hour(s))  MRSA PCR Screening     Status: None   Collection Time: 01/25/16  2:52 AM  Result Value Ref Range Status   MRSA by PCR NEGATIVE NEGATIVE Final    Comment:  The GeneXpert MRSA Assay (FDA approved for NASAL specimens only), is one component of a comprehensive MRSA colonization surveillance program. It is not intended to diagnose MRSA infection nor to guide or monitor treatment for MRSA infections.      Labs: Basic Metabolic Panel:  Recent Labs Lab 01/24/16 2356 01/25/16 0316 01/26/16 0506 01/27/16 0547  NA 140 139 140 136  K 3.6 3.8 3.8 3.2*  CL 102 106 112* 108  CO2 27 24 25 26   GLUCOSE 184* 147* 128* 139*  BUN 35* 30* 21* 16  CREATININE 1.15* 0.93 0.75 0.69  CALCIUM 9.5 8.3* 6.8* 7.5*   Liver Function Tests:  Recent Labs Lab 01/24/16 2356 01/25/16 0316  AST 42* 37  ALT 27 26  ALKPHOS 117 106  BILITOT 2.9* 2.2*  PROT 8.2* 7.6  ALBUMIN 3.1* 2.8*   No results for input(s): LIPASE, AMYLASE in the last 168 hours. No results for input(s): AMMONIA in the last 168 hours. CBC:  Recent Labs Lab 01/25/16 0207 01/25/16 0739  01/25/16 2204 01/26/16 0135 01/26/16 0506 01/26/16 2055 01/27/16 0547  WBC 12.3* 11.2*  --   --   --  6.1 6.4 5.6  HGB 11.0* 10.8*  < > 8.4* 8.7* 7.4* 10.9* 10.1*  HCT 32.7* 32.7*  < > 25.6* 26.7* 22.5* 32.3* 30.1*  MCV 91.3 92.1  --   --   --  91.5 91.2 90.7  PLT 118* 105*  --   --   --  80* 82* 77*  < > = values in this interval not displayed. Cardiac Enzymes:  Recent Labs Lab 01/25/16 2127 01/26/16 0135  TROPONINI <0.03 0.03   BNP: BNP (last 3 results)  Recent Labs   01/25/16 0316  BNP 27.6    ProBNP (last 3 results) No results for input(s): PROBNP in the last 8760 hours.  CBG:  Recent Labs Lab 01/26/16 0718 01/26/16 1226 01/26/16 1627 01/26/16 2101 01/27/16 0744  GLUCAP 129* 186* 130* 125* 138*       SignedDebbe Odea, MD [facility] 01/27/2016, 1:29 PM

## 2016-01-27 NOTE — Progress Notes (Signed)
Pt discharged to home earlier in AM/shift. DC instructions given. Prescription given for 2 meds. No concerns voiced. Pt c/o shoes that she had missing. Call placed to secretary Otho Ket) in ICU, who searched unit and did not find any shoes for pt. Pt reported she had left shoes in the ICU. Pt made aware that she would be notified if her shoes were found. Voiced understanding. Left unit in wheelchair pushed by nurse tech. Left in good condition. VWilliams,rn.

## 2016-01-27 NOTE — Progress Notes (Signed)
Occupational Therapy Evaluation Patient Details Name: Stacy Richard MRN: [id number] DOB: [birth year] Today's Date: 01/27/2016    History of Present Illness 70 y.o. female with medical history significant of hepatitis C cirrhosis, esophageal varice, hypertension, diabetes, GERD, CAD, depression, arthritis, who presents with coffee-ground emesis.   Clinical Impression   Patient reports she is being discharged home today. She reports having family and neighbors that can check on her and assist with transportation and groceries.     Follow Up Recommendations  No OT follow up;Supervision - Intermittent    Equipment Recommendations  None recommended by OT    Recommendations for Other Services PT consult     Precautions / Restrictions Precautions Precautions: Fall Restrictions Weight Bearing Restrictions: No      Mobility Bed Mobility               General bed mobility comments: up in chair  Transfers Overall transfer level: Needs assistance Equipment used: None Transfers: Sit to/from Stand Sit to Stand: Supervision         General transfer comment: for safety    Balance                                            ADL Overall ADL's : Needs assistance/impaired Eating/Feeding: Independent;Sitting   Grooming: Wash/dry hands;Set up;Sitting           Upper Body Dressing : Set up;Sitting   Lower Body Dressing: Supervision/safety;Sit to/from stand   Toilet Transfer: Supervision/safety;Ambulation;Comfort height toilet   Toileting- Clothing Manipulation and Hygiene: Supervision/safety;Sit to/from stand       Functional mobility during ADLs: Supervision/safety General ADL Comments: Mildly unsteady     Vision Vision Assessment?: No apparent visual deficits   Perception     Praxis      Pertinent Vitals/Pain Pain Assessment: 0-10 Pain Score: 6  Pain Location: lower back Pain Descriptors / Indicators: Aching;Sore Pain  Intervention(s): Limited activity within patient's tolerance;Monitored during session;Repositioned     Hand Dominance     Extremity/Trunk Assessment Upper Extremity Assessment Upper Extremity Assessment: Overall WFL for tasks assessed   Lower Extremity Assessment Lower Extremity Assessment: Defer to PT evaluation       Communication Communication Communication: No difficulties   Cognition Arousal/Alertness: Awake/alert Behavior During Therapy: WFL for tasks assessed/performed Overall Cognitive Status: Within Functional Limits for tasks assessed                     General Comments       Exercises       Shoulder Instructions      Home Living Family/patient expects to be discharged to:: Assisted living                             Home Equipment: [name] - single point;Walker - 4 wheels;Shower seat;Grab bars - toilet;Grab bars - tub/shower   Additional Comments: Has tub/shower with seat. Does not drive. ALF supplies lunch; pt on her own for all other meals. Relies on others to transport to grocery store.      Prior Functioning/Environment Level of Independence: Independent with assistive device(s)        Comments: cane and Rollator -- "whichever is closer"    OT Diagnosis: Generalized weakness   OT Problem List: Decreased strength;Decreased activity tolerance;Pain   OT Treatment/Interventions:  OT Goals(Current goals can be found in the care plan section) Acute Rehab OT Goals Patient Stated Goal: home today OT Goal Formulation: All assessment and education complete, DC therapy  OT Frequency:     Barriers to D/C:            Co-evaluation              End of Session Nurse Communication: Mobility status  Activity Tolerance: Patient tolerated treatment well Patient left: in bed;with call bell/phone within reach   Time: 0901-0913 OT Time Calculation (min): 12 min Charges:  OT General Charges $OT Visit: 1 Procedure OT  Evaluation $OT Eval Low Complexity: 1 Procedure G-Codes:    Stacy Richard A 02-12-16, 9:46 AM

## 2016-01-27 NOTE — Discharge Instructions (Signed)
Esophageal Varices The esophagus is the passage that connects the throat to the stomach. Esophageal varices are blood vessels in the esophagus that have become enlarged. They develop when extra blood is forced to flow through them because the blood's normal pathway is blocked. Without treatment these blood vessels eventually break and bleed (hemorrhage). A hemorrhage is life-threatening. CAUSES This condition may be caused by:  Scarring of the liver (cirrhosis) due to alcoholism. This is the most common cause.  Liver disease.  Severe heart failure.  A blood clot in the portal vein.  Sarcoidosis. This is an inflammatory disease that can affect the liver.  Schistosomiasis. This is a parasitic infection that can cause liver damage. SYMPTOMS Usually there are no symptoms unless the esophageal varices bleed. Symptoms of bleeding esophageal varices include:  Vomiting material that is bright red or that is black and looks like coffee grounds.  Coughing up blood.  Black, tarry stools.  Dizziness or lightheadedness.  Low blood pressure.  Loss of consciousness. DIAGNOSIS This condition is diagnosed with tests, such as:  Endoscopy. During this test a thin, lighted tube is inserted through the mouth and into the esophagus.  Blood tests. These may be done to check liver function, blood counts, and the body's ability to form blood clots. TREATMENT This condition may be treated with:  Medicines that reduce pressure in the esophageal varices and reduce the risk of bleeding.  Procedures to reduce pressure in the esophageal varices and reduce the risk of bleeding or stop bleeding. These include:  Variceal ligation. In this procedure, a rubber band is placed around the esophageal varices to keep them from bleeding.  Injection therapy. This treatment involves an injection that causes the esophageal varices to shrink and close (sclerotherapy). Medicines that tighten blood vessels or alter  blood flow may also be used.  Balloon tamponade. In this procedure, a tube is put into the esophagus and a balloon is passed through it and inflated.  Transjugular intrahepatic portosystemic shunt (TIPS) placement. In this procedure, a small tube is placed within the liver veins. This decreases blood flow and pressure to the esophageal varices.  A liver transplant. This may be done if other treatments do not work. HOME CARE INSTRUCTIONS  Take medicines only as directed by your health care provider.  Follow your health care provider's instructions about rest and physical activity. SEEK IMMEDIATE MEDICAL CARE IF:  You have any symptoms of this condition after treatment.  You are unable to eat or drink.  You have chest pain.   This information is not intended to replace advice given to you by your health care provider. Make sure you discuss any questions you have with your health care provider.   Document Released: 11/16/2003 Document Revised: 01/10/2015 Document Reviewed: 08/22/2014 Elsevier Interactive Patient Education [employer].

## 2016-01-29 LAB — TYPE AND SCREEN
ABO/RH(D): O POS
ANTIBODY SCREEN: NEGATIVE
UNIT DIVISION: 0
Unit division: 0

## 2016-02-13 ENCOUNTER — Other Ambulatory Visit: Payer: Self-pay | Admitting: Nurse Practitioner

## 2016-02-13 DIAGNOSIS — K7469 Other cirrhosis of liver: Secondary | ICD-10-CM

## 2016-02-23 ENCOUNTER — Encounter (HOSPITAL_COMMUNITY): Payer: Self-pay | Admitting: Emergency Medicine

## 2016-02-23 ENCOUNTER — Inpatient Hospital Stay (HOSPITAL_COMMUNITY)
Admission: EM | Admit: 2016-02-23 | Discharge: 2016-02-26 | DRG: 378 | Disposition: A | Discharge status: Home / Self Care | Payer: Medicare Other | Attending: Internal Medicine | Admitting: Internal Medicine

## 2016-02-23 DIAGNOSIS — K922 Gastrointestinal hemorrhage, unspecified: Secondary | ICD-10-CM | POA: Diagnosis present

## 2016-02-23 DIAGNOSIS — K449 Diaphragmatic hernia without obstruction or gangrene: Secondary | ICD-10-CM | POA: Diagnosis present

## 2016-02-23 DIAGNOSIS — I8500 Esophageal varices without bleeding: Secondary | ICD-10-CM

## 2016-02-23 DIAGNOSIS — K219 Gastro-esophageal reflux disease without esophagitis: Secondary | ICD-10-CM | POA: Diagnosis present

## 2016-02-23 DIAGNOSIS — B182 Chronic viral hepatitis C: Secondary | ICD-10-CM

## 2016-02-23 DIAGNOSIS — K3189 Other diseases of stomach and duodenum: Secondary | ICD-10-CM

## 2016-02-23 DIAGNOSIS — I10 Essential (primary) hypertension: Secondary | ICD-10-CM | POA: Diagnosis present

## 2016-02-23 DIAGNOSIS — Z7984 Long term (current) use of oral hypoglycemic drugs: Secondary | ICD-10-CM | POA: Diagnosis not present

## 2016-02-23 DIAGNOSIS — Z833 Family history of diabetes mellitus: Secondary | ICD-10-CM | POA: Diagnosis not present

## 2016-02-23 DIAGNOSIS — B1920 Unspecified viral hepatitis C without hepatic coma: Secondary | ICD-10-CM | POA: Diagnosis present

## 2016-02-23 DIAGNOSIS — I959 Hypotension, unspecified: Secondary | ICD-10-CM | POA: Diagnosis present

## 2016-02-23 DIAGNOSIS — D62 Acute posthemorrhagic anemia: Secondary | ICD-10-CM | POA: Diagnosis present

## 2016-02-23 DIAGNOSIS — E119 Type 2 diabetes mellitus without complications: Secondary | ICD-10-CM

## 2016-02-23 DIAGNOSIS — D696 Thrombocytopenia, unspecified: Secondary | ICD-10-CM | POA: Diagnosis present

## 2016-02-23 DIAGNOSIS — Z8249 Family history of ischemic heart disease and other diseases of the circulatory system: Secondary | ICD-10-CM

## 2016-02-23 DIAGNOSIS — Z87891 Personal history of nicotine dependence: Secondary | ICD-10-CM | POA: Diagnosis not present

## 2016-02-23 DIAGNOSIS — K7460 Unspecified cirrhosis of liver: Secondary | ICD-10-CM | POA: Diagnosis present

## 2016-02-23 DIAGNOSIS — K920 Hematemesis: Secondary | ICD-10-CM | POA: Diagnosis present

## 2016-02-23 DIAGNOSIS — R110 Nausea: Secondary | ICD-10-CM

## 2016-02-23 DIAGNOSIS — K766 Portal hypertension: Secondary | ICD-10-CM | POA: Diagnosis present

## 2016-02-23 DIAGNOSIS — K921 Melena: Secondary | ICD-10-CM | POA: Diagnosis present

## 2016-02-23 DIAGNOSIS — D638 Anemia in other chronic diseases classified elsewhere: Secondary | ICD-10-CM | POA: Diagnosis present

## 2016-02-23 DIAGNOSIS — I8501 Esophageal varices with bleeding: Secondary | ICD-10-CM | POA: Diagnosis not present

## 2016-02-23 DIAGNOSIS — Z8051 Family history of malignant neoplasm of kidney: Secondary | ICD-10-CM

## 2016-02-23 DIAGNOSIS — K7469 Other cirrhosis of liver: Secondary | ICD-10-CM | POA: Diagnosis not present

## 2016-02-23 LAB — COMPREHENSIVE METABOLIC PANEL
ALBUMIN: 2.9 g/dL — AB (ref 3.5–5.0)
ALK PHOS: 108 U/L (ref 38–126)
ALT: 23 U/L (ref 14–54)
AST: 39 U/L (ref 15–41)
Anion gap: 11 (ref 5–15)
BUN: 34 mg/dL — AB (ref 6–20)
CALCIUM: 8.1 mg/dL — AB (ref 8.9–10.3)
CHLORIDE: 106 mmol/L (ref 101–111)
CO2: 24 mmol/L (ref 22–32)
CREATININE: 0.91 mg/dL (ref 0.44–1.00)
GFR calc non Af Amer: >60 mL/min (ref >60)
GLUCOSE: 195 mg/dL — AB (ref 65–99)
Potassium: 3.8 mmol/L (ref 3.5–5.1)
SODIUM: 141 mmol/L (ref 135–145)
Total Bilirubin: 1.2 mg/dL (ref 0.3–1.2)
Total Protein: 7.6 g/dL (ref 6.5–8.1)

## 2016-02-23 LAB — CBC WITH DIFFERENTIAL/PLATELET
BASOS ABS: 0 10*3/uL (ref 0.0–0.1)
Basophils Relative: 0 %
EOS ABS: 0 10*3/uL (ref 0.0–0.7)
EOS PCT: 0 %
HCT: 33 % — ABNORMAL LOW (ref 36.0–46.0)
HEMOGLOBIN: 11 g/dL — AB (ref 12.0–15.0)
Lymphocytes Relative: 24 %
Lymphs Abs: 2.4 10*3/uL (ref 0.7–4.0)
MCH: 32.6 pg (ref 26.0–34.0)
MCHC: 33.3 g/dL (ref 30.0–36.0)
MCV: 97.9 fL (ref 78.0–100.0)
Monocytes Absolute: 0.7 10*3/uL (ref 0.1–1.0)
Monocytes Relative: 7 %
NEUTROS PCT: 69 %
Neutro Abs: 7 10*3/uL (ref 1.7–7.7)
PLATELETS: 158 10*3/uL (ref 150–400)
RBC: 3.37 MIL/uL — AB (ref 3.87–5.11)
RDW: 14.9 % (ref 11.5–15.5)
WBC: 10.1 10*3/uL (ref 4.0–10.5)

## 2016-02-23 LAB — TYPE AND SCREEN
ABO/RH(D): O POS
ANTIBODY SCREEN: NEGATIVE

## 2016-02-23 LAB — CBG MONITORING, ED: GLUCOSE-CAPILLARY: 188 mg/dL — AB (ref 65–99)

## 2016-02-23 LAB — CBC
HEMATOCRIT: 34.4 % — AB (ref 36.0–46.0)
HEMATOCRIT: 28.3 % — AB (ref 36.0–46.0)
HEMOGLOBIN: 11.1 g/dL — AB (ref 12.0–15.0)
Hemoglobin: 9.2 g/dL — ABNORMAL LOW (ref 12.0–15.0)
MCH: 31.4 pg (ref 26.0–34.0)
MCH: 30.9 pg (ref 26.0–34.0)
MCHC: 32.3 g/dL (ref 30.0–36.0)
MCHC: 32.5 g/dL (ref 30.0–36.0)
MCV: 97.2 fL (ref 78.0–100.0)
MCV: 95 fL (ref 78.0–100.0)
Platelets: 155 10*3/uL (ref 150–400)
Platelets: 123 10*3/uL — ABNORMAL LOW (ref 150–400)
RBC: 3.54 MIL/uL — ABNORMAL LOW (ref 3.87–5.11)
RBC: 2.98 MIL/uL — ABNORMAL LOW (ref 3.87–5.11)
RDW: 14.9 % (ref 11.5–15.5)
RDW: 14.8 % (ref 11.5–15.5)
WBC: 12.9 10*3/uL — ABNORMAL HIGH (ref 4.0–10.5)
WBC: 11.5 10*3/uL — ABNORMAL HIGH (ref 4.0–10.5)

## 2016-02-23 LAB — APTT: aPTT: 28 seconds (ref 24–37)

## 2016-02-23 LAB — PROTIME-INR
INR: 1.42 (ref 0.00–1.49)
Prothrombin Time: 16.9 seconds — ABNORMAL HIGH (ref 11.6–15.2)

## 2016-02-23 LAB — MRSA PCR SCREENING: MRSA by PCR: NEGATIVE

## 2016-02-23 MED ORDER — LORATADINE 10 MG PO TABS
10.0000 mg | ORAL_TABLET | Freq: Every day | ORAL | Status: DC
  Administered 2016-02-25 – 2016-02-26 (×2): 10 mg via ORAL
  Filled 2016-02-23 (×2): qty 1

## 2016-02-23 MED ORDER — DULOXETINE HCL 60 MG PO CPEP
60.0000 mg | ORAL_CAPSULE | Freq: Every day | ORAL | Status: DC
  Administered 2016-02-25 – 2016-02-26 (×2): 60 mg via ORAL
  Filled 2016-02-23: qty 1
  Filled 2016-02-23: qty 2

## 2016-02-23 MED ORDER — BUSPIRONE HCL 15 MG PO TABS
7.5000 mg | ORAL_TABLET | Freq: Every day | ORAL | Status: DC
  Administered 2016-02-25 – 2016-02-26 (×2): 7.5 mg via ORAL
  Filled 2016-02-23 (×3): qty 1

## 2016-02-23 MED ORDER — ONDANSETRON HCL 4 MG PO TABS: 4.0000 mg | ORAL_TABLET | Freq: Four times a day (QID) | ORAL | Status: DC | PRN

## 2016-02-23 MED ORDER — FAMOTIDINE IN NACL 20-0.9 MG/50ML-% IV SOLN
20.0000 mg | Freq: Once | INTRAVENOUS | Status: AC
  Administered 2016-02-23: 20 mg via INTRAVENOUS
  Filled 2016-02-23: qty 50

## 2016-02-23 MED ORDER — OXYBUTYNIN CHLORIDE ER 5 MG PO TB24
5.0000 mg | ORAL_TABLET | Freq: Every day | ORAL | Status: DC
  Administered 2016-02-25 – 2016-02-26 (×2): 5 mg via ORAL
  Filled 2016-02-23 (×2): qty 1

## 2016-02-23 MED ORDER — SODIUM CHLORIDE 0.9 % IV SOLN: INTRAVENOUS | Status: DC

## 2016-02-23 MED ORDER — ONDANSETRON HCL 4 MG/2ML IJ SOLN: 4.0000 mg | Freq: Four times a day (QID) | INTRAMUSCULAR | Status: DC | PRN

## 2016-02-23 MED ORDER — ACETAMINOPHEN 650 MG RE SUPP: 650.0000 mg | Freq: Four times a day (QID) | RECTAL | Status: DC | PRN

## 2016-02-23 MED ORDER — SODIUM CHLORIDE 0.9 % IV SOLN
50.0000 ug/h | INTRAVENOUS | Status: DC
  Administered 2016-02-23 – 2016-02-25 (×3): 50 ug/h via INTRAVENOUS
  Filled 2016-02-23 (×8): qty 1

## 2016-02-23 MED ORDER — SODIUM CHLORIDE 0.9 % IV BOLUS (SEPSIS)
500.0000 mL | Freq: Once | INTRAVENOUS | Status: AC
  Administered 2016-02-23: 500 mL via INTRAVENOUS

## 2016-02-23 MED ORDER — NADOLOL 20 MG PO TABS
20.0000 mg | ORAL_TABLET | Freq: Every day | ORAL | Status: DC
  Administered 2016-02-23 – 2016-02-26 (×3): 20 mg via ORAL
  Filled 2016-02-23 (×4): qty 1

## 2016-02-23 MED ORDER — OCTREOTIDE LOAD VIA INFUSION
25.0000 ug | Freq: Once | INTRAVENOUS | Status: AC
  Administered 2016-02-23: 25 ug via INTRAVENOUS
  Filled 2016-02-23: qty 13

## 2016-02-23 MED ORDER — LACTULOSE 10 GM/15ML PO SOLN
10.0000 g | Freq: Two times a day (BID) | ORAL | Status: DC
  Administered 2016-02-23 – 2016-02-26 (×5): 10 g via ORAL
  Filled 2016-02-23 (×7): qty 15

## 2016-02-23 MED ORDER — ACETAMINOPHEN 325 MG PO TABS: 650.0000 mg | ORAL_TABLET | Freq: Four times a day (QID) | ORAL | Status: DC | PRN

## 2016-02-23 MED ORDER — POTASSIUM CHLORIDE IN NACL 20-0.9 MEQ/L-% IV SOLN
INTRAVENOUS | Status: DC
  Administered 2016-02-23 – 2016-02-24 (×3): via INTRAVENOUS
  Filled 2016-02-23 (×4): qty 1000

## 2016-02-23 NOTE — ED Notes (Addendum)
Per EMS patient comes from home for hematemesis x3 that started around noon. Patient has cirrhosis and Hep C and not sure if related to cause or not.  Patient states started out bright blood then now emesis is dark, coffee ground color.

## 2016-02-23 NOTE — ED Provider Notes (Addendum)
CSN: [id number]     Arrival date & time 02/23/16  1420 History   First MD Initiated Contact with Patient 02/23/16 1423     Chief Complaint  Patient presents with  . Hematuria     (Consider location/radiation/quality/duration/timing/severity/associated sxs/prior Treatment) HPI Comments: Patient is a 70 year old female with a history of hepatic cirrhosis, hepatitis C currently on Harvoni and ribavirin, portal gastropathy and stage III varices, diabetes and hypertension presenting today with 4 episodes of hematemesis. Her initial episode was bright red blood but her last 2 episodes have been dark coffee ground emesis. She denies any abdominal pain, shortness of breath or chest pain. Generalized weakness but no nausea at this time. She denies any alcohol use for several years. She is still taking her medications as prescribed. Patient was hospitalized May 17 for similar symptoms and at that time was placed on IV Pepcid and required 1 unit of PRBCs. She was changed to nadalol by GI and EGD at the time showed no currently bleeding varices and portal gastropathy.  Patient denies any change in her medications or missed doses. She has been under more stress recently related to her daughter but has no other mitigating factors.  The history is provided by the patient.    Past Medical History  Diagnosis Date  . GERD (gastroesophageal reflux disease)   . Allergy     Rhinitis  . Hepatitis C   . Osteopenia   . NIDDM (non-insulin dependent diabetes mellitus)   . Osteoarthritis of knee     right knee  . Hypertension   . Arthritis     left hip  . Hepatic cirrhosis [facility])    Past Surgical History  Procedure Laterality Date  . Knee surgery    . Transthoracic echocardiogram  09/2012    EF 55-54%, mild LVH & mild conc hypertrophy, grade 1 diastolic dysfunction; mild MR  . Nm myocar perf wall motion  09/2012    lexiscan myoview; normal study, low risk  . Esophagogastroduodenoscopy (egd) with propofol N/A  08/17/2015    Procedure: ESOPHAGOGASTRODUODENOSCOPY (EGD) WITH PROPOFOL;  Surgeon: Clarene Essex, MD;  Location: [facility];  Service: Endoscopy;  Laterality: N/A;  . Esophageal banding N/A 08/17/2015    Procedure: ESOPHAGEAL BANDING;  Surgeon: Clarene Essex, MD;  Location: [facility];  Service: Endoscopy;  Laterality: N/A;  ? banding  . Colonoscopy with propofol Left 08/21/2015    Procedure: COLONOSCOPY WITH PROPOFOL;  Surgeon: Teena Irani, MD;  Location: [facility];  Service: Endoscopy;  Laterality: Left;  . Esophagogastroduodenoscopy N/A 01/25/2016    Procedure: ESOPHAGOGASTRODUODENOSCOPY (EGD);  Surgeon: Milus Banister, MD;  Location: [facility];  Service: Endoscopy;  Laterality: N/A;   Family History  Problem Relation Age of Onset  . Heart failure Mother   . Hepatitis Mother   . Kidney cancer Mother   . Heart disease Father   . Heart Problems Sister     murmur  . Diabetes type II Sister   . Heart Problems Daughter     murmur  . Bipolar disorder Daughter    Social History  Substance Use Topics  . Smoking status: Former Smoker    Quit date: 10/28/2009  . Smokeless tobacco: Never Used  . Alcohol Use: No   OB History    No data available     Review of Systems  All other systems reviewed and are negative.     Allergies  Review of patient's allergies indicates no known allergies.  Home Medications   Prior  to Admission medications   Medication Sig Start Date End Date Taking? Authorizing Provider  Artificial Tear Ointment (ARTIFICIAL TEARS) ointment Place 1 application into both eyes as needed.     Historical Provider, MD  busPIRone (BUSPAR) 7.5 MG tablet Take 1 tablet (7.5 mg total) by mouth daily. 01/03/16   Merian Capron, MD  Calcium Carb-Cholecalciferol (CALTRATE 600+D) 600-800 MG-UNIT TABS Take 1 tablet by mouth 2 (two) times daily.    Historical Provider, MD  calcium carbonate (TUMS - DOSED IN MG ELEMENTAL CALCIUM) 500 MG chewable tablet Chew 2 tablets by mouth 3  (three) times daily as needed for indigestion or heartburn.    Historical Provider, MD  DULoxetine (CYMBALTA) 60 MG capsule Take 1 capsule (60 mg total) by mouth daily. 01/03/16   Merian Capron, MD  famotidine (PEPCID) 40 MG tablet Take 1 tablet (40 mg total) by mouth 2 (two) times daily as needed for heartburn or indigestion. 01/27/16   Debbe Odea, MD  lactulose (CHRONULAC) 10 GM/15ML solution Take 10 g by mouth 2 (two) times daily.    Historical Provider, MD  Ledipasvir-Sofosbuvir (HARVONI) 90-400 MG TABS Take 1 tablet by mouth daily. 01/08/16   Historical Provider, MD  loratadine (CLARITIN) 10 MG tablet Take 10 mg by mouth daily.    Historical Provider, MD  Multiple Vitamin (MULTIVITAMIN WITH MINERALS) TABS tablet Take 1 tablet by mouth daily. Multivitamin with lutein and lycopene    Historical Provider, MD  nadolol (CORGARD) 20 MG tablet Take 1 tablet (20 mg total) by mouth daily. 01/27/16   Debbe Odea, MD  oxybutynin (DITROPAN-XL) 5 MG 24 hr tablet Take 5 mg by mouth daily.    Historical Provider, MD  Polyethyl Glycol-Propyl Glycol (SYSTANE OP) Apply 1 drop to eye daily as needed (dry eye).     Historical Provider, MD  ribavirin (REBETOL) 200 MG capsule Take 600 mg by mouth daily.     Historical Provider, MD   There were no vitals taken for this visit. Physical Exam  Constitutional: She is oriented to person, place, and time. She appears well-developed and well-nourished. No distress.  HENT:  Head: Normocephalic and atraumatic.  Mouth/Throat: Oropharynx is clear and moist. Mucous membranes are dry.  Dried blood in corners of the mouth  Eyes: Conjunctivae and EOM are normal. Pupils are equal, round, and reactive to light.  Neck: Normal range of motion. Neck supple.  Cardiovascular: Normal rate, regular rhythm and intact distal pulses.   No murmur heard. Peripheral pulses intact  Pulmonary/Chest: Effort normal and breath sounds normal. No respiratory distress. She has no wheezes. She has no  rales.  Abdominal: Soft. She exhibits distension. There is no tenderness. There is no rebound and no guarding.  Musculoskeletal: Normal range of motion. She exhibits no edema or tenderness.  Neurological: She is alert and oriented to person, place, and time.  Skin: Skin is warm and dry. No rash noted. No erythema.  Psychiatric: She has a normal mood and affect. Her behavior is normal.  Nursing note and vitals reviewed.   ED Course  Procedures (including critical care time) Labs Review Labs Reviewed  CBC WITH DIFFERENTIAL/PLATELET - Abnormal; Notable for the following:    RBC 3.37 (*)    Hemoglobin 11.0 (*)    HCT 33.0 (*)    All other components within normal limits  COMPREHENSIVE METABOLIC PANEL - Abnormal; Notable for the following:    Glucose, Bld 195 (*)    BUN 34 (*)    Calcium 8.1 (*)  Albumin 2.9 (*)    All other components within normal limits  PROTIME-INR - Abnormal; Notable for the following:    Prothrombin Time 16.9 (*)    All other components within normal limits  CBG MONITORING, ED - Abnormal; Notable for the following:    Glucose-Capillary 188 (*)    All other components within normal limits  APTT  I-STAT TROPOININ, ED  TYPE AND SCREEN    Imaging Review No results found. I have personally reviewed and evaluated these images and lab results as part of my medical decision-making.   EKG Interpretation   Date/Time:  Friday February 23 2016 14:31:59 EDT Ventricular Rate:  92 PR Interval:  122 QRS Duration: 89 QT Interval:  343 QTC Calculation: 424 R Axis:   5 Text Interpretation:  Sinus rhythm Low voltage, precordial leads  Borderline T wave abnormalities No significant change since last tracing  Confirmed by Maryan Rued  MD, Loree Fee ([id number]) on 02/23/2016 2:36:07 PM      MDM   Final diagnoses:  Acute upper GI bleeding    Patient is a 70 year old female with hepatic cirrhosis, esophageal varices being portal gastropathy who has a history of intermittent  hematemesis presenting today with similar symptoms. She has had 4 episodes of bloody or coffee-ground emesis since noon today. She denies any shortness of breath, chest pain or abdominal pain at this time. She is having some generalized weakness. She currently takes no blood thinners and has continued taking her hepatitis C treatment regime. She is currently on Pepcid because she cannot be on a PPI. She has not had any alcohol in years.  Based on EDG done in May for similar symptoms she had no bleeding varices at that time and the bleeding was thought to be due from portal gastropathy. She denies ASA or NSAIDs.  She was changed from Toprol to nadolol and continued on H2 blocker bid.  Patient is currently hemodynamically stable but concern for ongoing bleeding or worsening anemia that may require blood transfusion. EKG without acute change. CBC, CMP, PT/PTT, type and screen pending. We'll discuss with GI. Patient started on IV Pepcid.  4:01 PM Initially hemoglobin is stable at 11. CMP within normal limits except for an elevated BUNs. Coags within normal limits. Patient type and screen. Spoke with Dr. Oletta Lamas with [facility] who will come and evaluate the patient. Will admit for observation.  Blanchie Dessert, MD 02/23/16 1601  Blanchie Dessert, MD 02/23/16 [phone]

## 2016-02-23 NOTE — H&P (Signed)
History and Physical    Stacy Richard [id number] DOB: May 28, 1946 DOA: 02/23/2016    PCP: Chesley Noon, MD  Patient coming from: home  Chief Complaint: vomiting blood  HPI: Stacy Richard is a 70 y.o. 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 She was discharged with Nadolol and Pepcid as PPI is contraindicated with Harvoni. She has had 4 episodes of bloody vomiting today and feels lightheaded. No abdominal pain and no stool today.  ED Course:  Started on IVF, Hb is 11 (last Hb 10), vitals are stable at this time, Pepcid given IV  Review of Systems:  All other systems reviewed and apart from HPI, are negative.  Past Medical History  Diagnosis Date  . GERD (gastroesophageal reflux disease)   . Allergy     Rhinitis  . Hepatitis C   . Osteopenia   . NIDDM (non-insulin dependent diabetes mellitus)   . Osteoarthritis of knee     right knee  . Hypertension   . Arthritis     left hip  . Hepatic cirrhosis [facility])     Past Surgical History  Procedure Laterality Date  . Knee surgery    . Transthoracic echocardiogram  09/2012    EF 55-54%, mild LVH & mild conc hypertrophy, grade 1 diastolic dysfunction; mild MR  . Nm myocar perf wall motion  09/2012    lexiscan myoview; normal study, low risk  . Esophagogastroduodenoscopy (egd) with propofol N/A 08/17/2015    Procedure: ESOPHAGOGASTRODUODENOSCOPY (EGD) WITH PROPOFOL;  Surgeon: Clarene Essex, MD;  Location: [facility];  Service: Endoscopy;  Laterality: N/A;  . Esophageal banding N/A 08/17/2015    Procedure: ESOPHAGEAL BANDING;  Surgeon: Clarene Essex, MD;  Location: [facility];  Service: Endoscopy;  Laterality: N/A;  ? banding  . Colonoscopy with propofol Left 08/21/2015    Procedure:  COLONOSCOPY WITH PROPOFOL;  Surgeon: Teena Irani, MD;  Location: [facility];  Service: Endoscopy;  Laterality: Left;  . Esophagogastroduodenoscopy N/A 01/25/2016    Procedure: ESOPHAGOGASTRODUODENOSCOPY (EGD);  Surgeon: Milus Banister, MD;  Location: [facility];  Service: Endoscopy;  Laterality: N/A;    Social History:   reports that she quit smoking about 6 years ago. She has never used smokeless tobacco. She reports that she uses illicit drugs (Marijuana). She reports that she does not drink alcohol.  No Known Allergies  Family History  Problem Relation Age of Onset  . Heart failure Mother   . Hepatitis Mother   . Kidney cancer Mother   . Heart disease Father   . Heart Problems Sister     murmur  . Diabetes type II Sister   . Heart Problems Daughter     murmur  . Bipolar disorder Daughter      Prior to Admission medications   Medication Sig Start Date End Date Taking? Authorizing Provider  Artificial Tear Ointment (ARTIFICIAL TEARS) ointment Place 1 application into both eyes as needed.    Yes Historical Provider, MD  busPIRone (BUSPAR) 7.5 MG tablet Take 1 tablet (7.5 mg total) by mouth daily. 01/03/16  Yes Merian Capron, MD  Calcium Carb-Cholecalciferol (CALTRATE 600+D) 600-800 MG-UNIT TABS Take 1 tablet by mouth 2 (two) times daily.   Yes Historical Provider, MD  calcium carbonate (TUMS - DOSED IN MG ELEMENTAL CALCIUM) 500  MG chewable tablet Chew 2 tablets by mouth 3 (three) times daily as needed for indigestion or heartburn.   Yes Historical Provider, MD  DULoxetine (CYMBALTA) 60 MG capsule Take 1 capsule (60 mg total) by mouth daily. 01/03/16  Yes Merian Capron, MD  famotidine (PEPCID) 40 MG tablet Take 1 tablet (40 mg total) by mouth 2 (two) times daily as needed for heartburn or indigestion. 01/27/16  Yes Debbe Odea, MD  lactulose (CHRONULAC) 10 GM/15ML solution Take 10 g by mouth 2 (two) times daily.   Yes Historical Provider, MD  Ledipasvir-Sofosbuvir (HARVONI) 90-400  MG TABS Take 1 tablet by mouth daily. 01/08/16  Yes Historical Provider, MD  loratadine (CLARITIN) 10 MG tablet Take 10 mg by mouth daily.   Yes Historical Provider, MD  Multiple Vitamin (MULTIVITAMIN WITH MINERALS) TABS tablet Take 1 tablet by mouth daily. Multivitamin with lutein and lycopene   Yes Historical Provider, MD  nadolol (CORGARD) 20 MG tablet Take 1 tablet (20 mg total) by mouth daily. Patient taking differently: Take 20 mg by mouth daily as needed (take if pulse or heart is greater than 60).  01/27/16  Yes Debbe Odea, MD  oxybutynin (DITROPAN-XL) 5 MG 24 hr tablet Take 5 mg by mouth daily.   Yes Historical Provider, MD  Polyethyl Glycol-Propyl Glycol (SYSTANE OP) Apply 1 drop to eye daily as needed (dry eye).    Yes Historical Provider, MD    Physical Exam: Filed Vitals:   02/23/16 1438 02/23/16 1655  BP: 128/79 108/77  Pulse: 94 85  Temp: 98.2 F (36.8 C)   TempSrc: Oral   Resp: 18 16  Height: 4\' 11"  (1.499 m)   Weight: 79.379 kg (175 lb)   SpO2: 100% 100%      Constitutional: NAD, calm, comfortable Eyes: PERTLA, lids and conjunctivae normal ENMT: Mucous membranes are moist. Posterior pharynx clear of any exudate or lesions. Normal dentition.  Neck: normal, supple, no masses, no thyromegaly Respiratory: clear to auscultation bilaterally, no wheezing, no crackles. Normal respiratory effort. No accessory muscle use.  Cardiovascular: S1 & S2 heard, regular rate and rhythm, no murmurs / rubs / gallops. No extremity edema. 2+ pedal pulses. No carotid bruits.  Abdomen: No distension, no tenderness, no masses palpated. No hepatosplenomegaly. Bowel sounds normal.  Musculoskeletal: no clubbing / cyanosis. No joint deformity upper and lower extremities. Good ROM, no contractures. Normal muscle tone.  Skin: no rashes, lesions, ulcers. No induration Neurologic: CN 2-12 grossly intact. Sensation intact, DTR normal. Strength 5/5 in all 4 limbs.  Psychiatric: Normal judgment and  insight. Alert and oriented x 3. Normal mood.     Labs on Admission: I have personally reviewed following labs and imaging studies  CBC:  Recent Labs Lab 02/23/16 1451  WBC 10.1  NEUTROABS 7.0  HGB 11.0*  HCT 33.0*  MCV 97.9  PLT 0000000   Basic Metabolic Panel:  Recent Labs Lab 02/23/16 1451  NA 141  K 3.8  CL 106  CO2 24  GLUCOSE 195*  BUN 34*  CREATININE 0.91  CALCIUM 8.1*   GFR: Estimated Creatinine Clearance: 53.9 mL/min (by C-G formula based on Cr of 0.91). Liver Function Tests:  Recent Labs Lab 02/23/16 1451  AST 39  ALT 23  ALKPHOS 108  BILITOT 1.2  PROT 7.6  ALBUMIN 2.9*   No results for input(s): LIPASE, AMYLASE in the last 168 hours. No results for input(s): AMMONIA in the last 168 hours. Coagulation Profile:  Recent Labs Lab 02/23/16 1451  INR  1.42   Cardiac Enzymes: No results for input(s): CKTOTAL, CKMB, CKMBINDEX, TROPONINI in the last 168 hours. BNP (last 3 results) No results for input(s): PROBNP in the last 8760 hours. HbA1C: No results for input(s): HGBA1C in the last 72 hours. CBG:  Recent Labs Lab 02/23/16 1437  GLUCAP 188*   Lipid Profile: No results for input(s): CHOL, HDL, LDLCALC, TRIG, CHOLHDL, LDLDIRECT in the last 72 hours. Thyroid Function Tests: No results for input(s): TSH, T4TOTAL, FREET4, T3FREE, THYROIDAB in the last 72 hours. Anemia Panel: No results for input(s): VITAMINB12, FOLATE, FERRITIN, TIBC, IRON, RETICCTPCT in the last 72 hours. Urine analysis: No results found for: COLORURINE, APPEARANCEUR, LABSPEC, PHURINE, GLUCOSEU, HGBUR, BILIRUBINUR, KETONESUR, PROTEINUR, UROBILINOGEN, NITRITE, LEUKOCYTESUR Sepsis Labs: @LABRCNTIP (procalcitonin:4,lacticidven:4) )No results found for this or any previous visit (from the past 240 hour(s)).   Radiological Exams on Admission: No results found.  EKG: Independently reviewed. Sinus rhythm at 92bpm  Assessment/Plan Principal Problem:   Hematemesis   Esophageal  varices    Portal hypertensive gastropathy Anemia of chronic disease - no drop in Hb yet- follow H and H every 6 hrs- transfuse if < 8 or very symptomatic - cont IVF, follow I and O - Dr Oletta Lamas has evaluated her and recommends NPO after midnight for EGD tomorrow - clear liquid until then - Octreotide, Pepcid  Active Problems:   Hepatitis C with cirrhosis - cont medications- 1 month into treatment    Type 2 diabetes mellitus  - ISS while here - normally diet controlled with HbA1c of 6.4    DVT prophylaxis: SCDs Code Status: Full code  Family Communication:   Disposition Plan: admit to SDU - 3-4 day stay Consults called: GI - [facility]   Admission status: admit     [facility] MD [facility] Pager: [email or web address] Password [email or web address] 7PM-7AM, please contact night-coverage   02/23/2016, 5:16 PM

## 2016-02-23 NOTE — ED Notes (Signed)
Bed: [id number] Expected date: 02/23/16 Expected time:  Means of arrival:  Comments: Gi Bleed.

## 2016-02-23 NOTE — Consult Note (Signed)
[facility] CONSULT Reason for consult: Hematemesis Referring Physician: [facility] hospitalist. PCP: Stacy Richard is an 70 y.o. female.  HPI: she has cirrhosis hepatitis C and is currently being treated with Harvoni and ribavirin by the liver clinic here. She has been on these medications for less than one month. 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 The patient had G.I. bleeding in the past. She was hospitalized 12/16 as well as 5/17 for G.I. bleeding and had EGD on both occasions. 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 No varices were banded. Apparently it was not felt that she was bleeding from the varices. 12/16 she had colonoscopy it was non-reveal. She vomited up bright red blood today and then some coffee ground material. She denies taking any NSAIDs. She is not on any acid reducing medication. She does have a history of reflux in the past. She denies abdominal pain. The only acid reducing medication that she takes his PRN times.  Past Medical History  Diagnosis Date  . GERD (gastroesophageal reflux disease)   . Allergy     Rhinitis  . Hepatitis C   . Osteopenia   . NIDDM (non-insulin dependent diabetes mellitus)   . Osteoarthritis of knee     right knee  . Hypertension   . Arthritis     left hip  . Hepatic cirrhosis [facility])     Past Surgical History  Procedure Laterality Date  . Knee surgery    . Transthoracic echocardiogram  09/2012    EF 55-54%, mild LVH & mild conc hypertrophy, grade 1 diastolic dysfunction; mild MR  . Nm myocar perf wall motion  09/2012    lexiscan myoview; normal study, low risk  . Esophagogastroduodenoscopy (egd) with propofol N/A 08/17/2015    Procedure: ESOPHAGOGASTRODUODENOSCOPY (EGD) WITH PROPOFOL;  Surgeon: Stacy Essex, MD;  Location: [facility];  Service: Endoscopy;  Laterality: N/A;  . Esophageal banding N/A 08/17/2015    Procedure:  ESOPHAGEAL BANDING;  Surgeon: Stacy Essex, MD;  Location: [facility];  Service: Endoscopy;  Laterality: N/A;  ? banding  . Colonoscopy with propofol Left 08/21/2015    Procedure: COLONOSCOPY WITH PROPOFOL;  Surgeon: Stacy Irani, MD;  Location: [facility];  Service: Endoscopy;  Laterality: Left;  . Esophagogastroduodenoscopy N/A 01/25/2016    Procedure: ESOPHAGOGASTRODUODENOSCOPY (EGD);  Surgeon: Stacy Banister, MD;  Location: [facility];  Service: Endoscopy;  Laterality: N/A;    Family History  Problem Relation Age of Onset  . Heart failure Mother   . Hepatitis Mother   . Kidney cancer Mother   . Heart disease Father   . Heart Problems Sister     murmur  . Diabetes type II Sister   . Heart Problems Daughter     murmur  . Bipolar disorder Daughter     Social History:  reports that she quit smoking about 6 years ago. She has never used smokeless tobacco. She reports that she uses illicit drugs (Marijuana). She reports that she does not drink alcohol.  Allergies: No Known Allergies  Medications; Prior to Admission medications   Medication Sig Start Date End Date Taking? Authorizing Provider  Artificial Tear Ointment (ARTIFICIAL TEARS) ointment Place 1 application into both eyes as needed.    Yes Historical Provider, MD  busPIRone (BUSPAR) 7.5 MG tablet Take 1 tablet (7.5 mg total) by mouth daily. 01/03/16  Yes Stacy Capron, MD  Calcium Carb-Cholecalciferol (CALTRATE 600+D)  600-800 MG-UNIT TABS Take 1 tablet by mouth 2 (two) times daily.   Yes Historical Provider, MD  calcium carbonate (TUMS - DOSED IN MG ELEMENTAL CALCIUM) 500 MG chewable tablet Chew 2 tablets by mouth 3 (three) times daily as needed for indigestion or heartburn.   Yes Historical Provider, MD  DULoxetine (CYMBALTA) 60 MG capsule Take 1 capsule (60 mg total) by mouth daily. 01/03/16  Yes Stacy Capron, MD  famotidine (PEPCID) 40 MG tablet Take 1 tablet (40 mg total) by mouth 2 (two) times daily as needed for heartburn  or indigestion. 01/27/16  Yes Stacy Odea, MD  lactulose (CHRONULAC) 10 GM/15ML solution Take 10 g by mouth 2 (two) times daily.   Yes Historical Provider, MD  Ledipasvir-Sofosbuvir (HARVONI) 90-400 MG TABS Take 1 tablet by mouth daily. 01/08/16  Yes Historical Provider, MD  loratadine (CLARITIN) 10 MG tablet Take 10 mg by mouth daily.   Yes Historical Provider, MD  Multiple Vitamin (MULTIVITAMIN WITH MINERALS) TABS tablet Take 1 tablet by mouth daily. Multivitamin with lutein and lycopene   Yes Historical Provider, MD  nadolol (CORGARD) 20 MG tablet Take 1 tablet (20 mg total) by mouth daily. Patient taking differently: Take 20 mg by mouth daily as needed (take if pulse or heart is greater than 60).  01/27/16  Yes Stacy Odea, MD  oxybutynin (DITROPAN-XL) 5 MG 24 hr tablet Take 5 mg by mouth daily.   Yes Historical Provider, MD  Polyethyl Glycol-Propyl Glycol (SYSTANE OP) Apply 1 drop to eye daily as needed (dry eye).    Yes Historical Provider, MD     PRN Meds  Results for orders placed or performed during the hospital encounter of 02/23/16 (from the past 48 hour(s))  CBG monitoring, ED     Status: Abnormal   Collection Time: 02/23/16  2:37 PM  Result Value Ref Range   Glucose-Capillary 188 (H) 65 - 99 mg/dL  Type and screen     Status: None   Collection Time: 02/23/16  2:50 PM  Result Value Ref Range   ABO/RH(D) O POS    Antibody Screen NEG    Sample Expiration 02/26/2016   CBC with Differential/Platelet     Status: Abnormal   Collection Time: 02/23/16  2:51 PM  Result Value Ref Range   WBC 10.1 4.0 - 10.5 K/uL   RBC 3.37 (Richard) 3.87 - 5.11 MIL/uL   Hemoglobin 11.0 (Richard) 12.0 - 15.0 g/dL   HCT 33.0 (Richard) 36.0 - 46.0 %   MCV 97.9 78.0 - 100.0 fL   MCH 32.6 26.0 - 34.0 pg   MCHC 33.3 30.0 - 36.0 g/dL   RDW 14.9 11.5 - 15.5 %   Platelets 158 150 - 400 K/uL   Neutrophils Relative % 69 %   Neutro Abs 7.0 1.7 - 7.7 K/uL   Lymphocytes Relative 24 %   Lymphs Abs 2.4 0.7 - 4.0 K/uL    Monocytes Relative 7 %   Monocytes Absolute 0.7 0.1 - 1.0 K/uL   Eosinophils Relative 0 %   Eosinophils Absolute 0.0 0.0 - 0.7 K/uL   Basophils Relative 0 %   Basophils Absolute 0.0 0.0 - 0.1 K/uL  Comprehensive metabolic panel     Status: Abnormal   Collection Time: 02/23/16  2:51 PM  Result Value Ref Range   Sodium 141 135 - 145 mmol/Richard   Potassium 3.8 3.5 - 5.1 mmol/Richard   Chloride 106 101 - 111 mmol/Richard   CO2 24 22 - 32 mmol/Richard  Glucose, Bld 195 (H) 65 - 99 mg/dL   BUN 34 (H) 6 - 20 mg/dL   Creatinine, Ser 0.91 0.44 - 1.00 mg/dL   Calcium 8.1 (Richard) 8.9 - 10.3 mg/dL   Total Protein 7.6 6.5 - 8.1 g/dL   Albumin 2.9 (Richard) 3.5 - 5.0 g/dL   AST 39 15 - 41 U/Richard   ALT 23 14 - 54 U/Richard   Alkaline Phosphatase 108 38 - 126 U/Richard   Total Bilirubin 1.2 0.3 - 1.2 mg/dL   GFR calc non Af Amer >60 >60 mL/min   GFR calc Af Amer >60 >60 mL/min    Comment: (NOTE) The eGFR has been calculated using the CKD EPI equation. This calculation has not been validated in all clinical situations. eGFR's persistently <60 mL/min signify possible Chronic Kidney Disease.    Anion gap 11 5 - 15  Protime-INR     Status: Abnormal   Collection Time: 02/23/16  2:51 PM  Result Value Ref Range   Prothrombin Time 16.9 (H) 11.6 - 15.2 seconds   INR 1.42 0.00 - 1.49  APTT     Status: None   Collection Time: 02/23/16  2:51 PM  Result Value Ref Range   aPTT 28 24 - 37 seconds    No results found.             Blood pressure 128/79, pulse 94, temperature 98.2 F (36.8 C), temperature source Oral, resp. rate 18, height 4' 11" (1.499 m), weight 79.379 kg (175 lb), SpO2 100 %.  Physical exam:   General--Pleasant female no acute distress ENT-- she does have some dried blood at the edge of her mouth. She is nonicteric. Neck-- supple with no lymphadenopathy Heart-- regular rate and rhythm without murmurs are gallops. Lungs-- clear Abdomen-- soft and nontender somewhat obese. Liver non-palpable Psych-- alert  oriented appropriate   Assessment: 1. Hematemesis. The patient's hemoglobin is relatively stable. She has had previous reflux but has not been on PPI therapy since she has been on Harvoni. It is possible she could have esophagitis or she could be bleeding from portal gastropathy. Since she does have known varices I think she does need endoscopic evaluation and possible banding. Have discussed this in detail with the patient 2. Cirrhosis secondary to hepatitis C. She currently is on treatment with Harvoni and ribavirin. 3. Type II diabetes  Plan: 1. Agree with going ahead and placing patient on Pepcid IV rather than PPI since PPI can react with Harvoni. 2. Will tentatively plan on EGD with banding of esophageal varices tomorrow by Dr. Amedeo Plenty. We'll keep her NPO after midnight. Have discussed procedure with patient. If she bleed significantly during the night, I would start her on octreotide infusion.   EDWARDS JR,Stacy Richard 02/23/2016, 4:48 PM   This note was created using voice recognition software and minor errors may Have occurred unintentionally. Pager: [phone] If no answer or after hours call [phone]

## 2016-02-24 ENCOUNTER — Encounter (HOSPITAL_COMMUNITY)
Admission: EM | Disposition: A | Discharge status: Home / Self Care | Payer: Self-pay | Source: Home / Self Care | Attending: Internal Medicine

## 2016-02-24 ENCOUNTER — Encounter (HOSPITAL_COMMUNITY): Payer: Self-pay

## 2016-02-24 DIAGNOSIS — I8500 Esophageal varices without bleeding: Secondary | ICD-10-CM

## 2016-02-24 DIAGNOSIS — K7469 Other cirrhosis of liver: Secondary | ICD-10-CM

## 2016-02-24 HISTORY — PX: ESOPHAGOGASTRODUODENOSCOPY: SHX5428

## 2016-02-24 LAB — CBC
HEMATOCRIT: 25.3 % — AB (ref 36.0–46.0)
HEMATOCRIT: 25.7 % — AB (ref 36.0–46.0)
HEMOGLOBIN: 8.3 g/dL — AB (ref 12.0–15.0)
HEMOGLOBIN: 8.4 g/dL — AB (ref 12.0–15.0)
MCH: 31.3 pg (ref 26.0–34.0)
MCH: 31.7 pg (ref 26.0–34.0)
MCHC: 32.8 g/dL (ref 30.0–36.0)
MCHC: 32.7 g/dL (ref 30.0–36.0)
MCV: 95.5 fL (ref 78.0–100.0)
MCV: 97 fL (ref 78.0–100.0)
Platelets: 110 10*3/uL — ABNORMAL LOW (ref 150–400)
Platelets: 99 10*3/uL — ABNORMAL LOW (ref 150–400)
RBC: 2.65 MIL/uL — ABNORMAL LOW (ref 3.87–5.11)
RBC: 2.65 MIL/uL — AB (ref 3.87–5.11)
RDW: 15.2 % (ref 11.5–15.5)
RDW: 15.4 % (ref 11.5–15.5)
WBC: 9.5 10*3/uL (ref 4.0–10.5)
WBC: 7.8 10*3/uL (ref 4.0–10.5)

## 2016-02-24 LAB — BASIC METABOLIC PANEL
ANION GAP: 3 — AB (ref 5–15)
BUN: 19 mg/dL (ref 6–20)
CALCIUM: 6.4 mg/dL — AB (ref 8.9–10.3)
CO2: 20 mmol/L — AB (ref 22–32)
Chloride: 117 mmol/L — ABNORMAL HIGH (ref 101–111)
Creatinine, Ser: 0.53 mg/dL (ref 0.44–1.00)
GFR calc Af Amer: >60 mL/min (ref >60)
GFR calc non Af Amer: >60 mL/min (ref >60)
GLUCOSE: 107 mg/dL — AB (ref 65–99)
Potassium: 4.8 mmol/L (ref 3.5–5.1)
Sodium: 140 mmol/L (ref 135–145)

## 2016-02-24 LAB — LACTIC ACID, PLASMA: LACTIC ACID, VENOUS: 1.8 mmol/L (ref 0.5–2.0)

## 2016-02-24 SURGERY — EGD (ESOPHAGOGASTRODUODENOSCOPY)
Anesthesia: Moderate Sedation

## 2016-02-24 MED ORDER — LEDIPASVIR-SOFOSBUVIR 90-400 MG PO TABS
1.0000 | ORAL_TABLET | Freq: Every day | ORAL | Status: DC
  Administered 2016-02-24 – 2016-02-26 (×3): 1 via ORAL

## 2016-02-24 MED ORDER — BUTAMBEN-TETRACAINE-BENZOCAINE 2-2-14 % EX AERO
INHALATION_SPRAY | CUTANEOUS | Status: DC | PRN
  Administered 2016-02-24: 2 via TOPICAL

## 2016-02-24 MED ORDER — NON FORMULARY: 1.0000 | Freq: Every day | Status: DC

## 2016-02-24 MED ORDER — SODIUM CHLORIDE 0.9 % IV BOLUS (SEPSIS)
500.0000 mL | Freq: Once | INTRAVENOUS | Status: AC
  Administered 2016-02-24: 500 mL via INTRAVENOUS

## 2016-02-24 MED ORDER — DIPHENHYDRAMINE HCL 50 MG/ML IJ SOLN
INTRAMUSCULAR | Status: AC
  Filled 2016-02-24: qty 1

## 2016-02-24 MED ORDER — MIDAZOLAM HCL 5 MG/ML IJ SOLN
INTRAMUSCULAR | Status: AC
  Filled 2016-02-24: qty 2

## 2016-02-24 MED ORDER — FENTANYL CITRATE (PF) 100 MCG/2ML IJ SOLN
INTRAMUSCULAR | Status: AC
  Filled 2016-02-24: qty 2

## 2016-02-24 MED ORDER — MIDAZOLAM HCL 10 MG/2ML IJ SOLN
INTRAMUSCULAR | Status: DC | PRN
  Administered 2016-02-24 (×3): 2 mg via INTRAVENOUS

## 2016-02-24 MED ORDER — SODIUM CHLORIDE 0.9 % IV BOLUS (SEPSIS)
1000.0000 mL | Freq: Once | INTRAVENOUS | Status: AC
  Administered 2016-02-24: 1000 mL via INTRAVENOUS

## 2016-02-24 MED ORDER — DIPHENHYDRAMINE HCL 50 MG/ML IJ SOLN
INTRAMUSCULAR | Status: DC | PRN
  Administered 2016-02-24 (×2): 25 mg via INTRAVENOUS

## 2016-02-24 MED ORDER — FENTANYL CITRATE (PF) 100 MCG/2ML IJ SOLN
INTRAMUSCULAR | Status: DC | PRN
  Administered 2016-02-24: 25 ug via INTRAVENOUS

## 2016-02-24 NOTE — Progress Notes (Signed)
Pt lactic acid results paged to MD. Nurse also gave update on patient still being hypotensive in [id number] systolic. Nurse bladder scanned patient due to no urine output for shift and patient had approx 365ml of urine in bladder. Pt remains slightly lethargic but able to answer questions appropriately. Will continue to monitor.

## 2016-02-24 NOTE — Progress Notes (Signed)
[facility] Progress Note  Subjective: Normal-appearing stool overnight, no vomiting no abdominal pain  Objective: Vital signs in last 24 hours: Temp:  [98.2 F (36.8 C)-99 F (37.2 C)] 98.6 F (37 C) (06/17 0800) Pulse Rate:  [78-94] 78 (06/16 1700) Resp:  [12-31] 21 (06/17 0935) BP: (61-142)/(19-79) 80/29 mmHg (06/17 0935) SpO2:  [96 %-100 %] 100 % (06/17 0935) Weight:  [79.379 kg (175 lb)] 79.379 kg (175 lb) (06/16 1438) Weight change:    PE: Alert, oriented in no acute distress, abdomen soft minimally distended  Lab Results: Results for orders placed or performed during the hospital encounter of 02/23/16 (from the past 24 hour(s))  CBG monitoring, ED     Status: Abnormal   Collection Time: 02/23/16  2:37 PM  Result Value Ref Range   Glucose-Capillary 188 (H) 65 - 99 mg/dL  Type and screen     Status: None   Collection Time: 02/23/16  2:50 PM  Result Value Ref Range   ABO/RH(D) O POS    Antibody Screen NEG    Sample Expiration 02/26/2016   CBC with Differential/Platelet     Status: Abnormal   Collection Time: 02/23/16  2:51 PM  Result Value Ref Range   WBC 10.1 4.0 - 10.5 K/uL   RBC 3.37 (L) 3.87 - 5.11 MIL/uL   Hemoglobin 11.0 (L) 12.0 - 15.0 g/dL   HCT 33.0 (L) 36.0 - 46.0 %   MCV 97.9 78.0 - 100.0 fL   MCH 32.6 26.0 - 34.0 pg   MCHC 33.3 30.0 - 36.0 g/dL   RDW 14.9 11.5 - 15.5 %   Platelets 158 150 - 400 K/uL   Neutrophils Relative % 69 %   Neutro Abs 7.0 1.7 - 7.7 K/uL   Lymphocytes Relative 24 %   Lymphs Abs 2.4 0.7 - 4.0 K/uL   Monocytes Relative 7 %   Monocytes Absolute 0.7 0.1 - 1.0 K/uL   Eosinophils Relative 0 %   Eosinophils Absolute 0.0 0.0 - 0.7 K/uL   Basophils Relative 0 %   Basophils Absolute 0.0 0.0 - 0.1 K/uL  Comprehensive metabolic panel     Status: Abnormal   Collection Time: 02/23/16  2:51 PM  Result Value Ref Range   Sodium 141 135 - 145 mmol/L   Potassium 3.8 3.5 - 5.1 mmol/L   Chloride 106 101 - 111 mmol/L   CO2 24 22  - 32 mmol/L   Glucose, Bld 195 (H) 65 - 99 mg/dL   BUN 34 (H) 6 - 20 mg/dL   Creatinine, Ser 0.91 0.44 - 1.00 mg/dL   Calcium 8.1 (L) 8.9 - 10.3 mg/dL   Total Protein 7.6 6.5 - 8.1 g/dL   Albumin 2.9 (L) 3.5 - 5.0 g/dL   AST 39 15 - 41 U/L   ALT 23 14 - 54 U/L   Alkaline Phosphatase 108 38 - 126 U/L   Total Bilirubin 1.2 0.3 - 1.2 mg/dL   GFR calc non Af Amer >60 >60 mL/min   GFR calc Af Amer >60 >60 mL/min   Anion gap 11 5 - 15  Protime-INR     Status: Abnormal   Collection Time: 02/23/16  2:51 PM  Result Value Ref Range   Prothrombin Time 16.9 (H) 11.6 - 15.2 seconds   INR 1.42 0.00 - 1.49  APTT     Status: None   Collection Time: 02/23/16  2:51 PM  Result Value Ref Range   aPTT 28 24 - 37 seconds  MRSA PCR Screening     Status: None   Collection Time: 02/23/16  5:50 PM  Result Value Ref Range   MRSA by PCR NEGATIVE NEGATIVE  CBC     Status: Abnormal   Collection Time: 02/23/16  6:16 PM  Result Value Ref Range   WBC 12.9 (H) 4.0 - 10.5 K/uL   RBC 3.54 (L) 3.87 - 5.11 MIL/uL   Hemoglobin 11.1 (L) 12.0 - 15.0 g/dL   HCT 34.4 (L) 36.0 - 46.0 %   MCV 97.2 78.0 - 100.0 fL   MCH 31.4 26.0 - 34.0 pg   MCHC 32.3 30.0 - 36.0 g/dL   RDW 14.9 11.5 - 15.5 %   Platelets 155 150 - 400 K/uL  CBC     Status: Abnormal   Collection Time: 02/23/16 10:44 PM  Result Value Ref Range   WBC 11.5 (H) 4.0 - 10.5 K/uL   RBC 2.98 (L) 3.87 - 5.11 MIL/uL   Hemoglobin 9.2 (L) 12.0 - 15.0 g/dL   HCT 28.3 (L) 36.0 - 46.0 %   MCV 95.0 78.0 - 100.0 fL   MCH 30.9 26.0 - 34.0 pg   MCHC 32.5 30.0 - 36.0 g/dL   RDW 14.8 11.5 - 15.5 %   Platelets 123 (L) 150 - 400 K/uL  Basic metabolic panel     Status: None   Collection Time: 02/24/16  4:53 AM  Result Value Ref Range   Sodium  135 - 145 mmol/L    QUESTIONABLE RESULTS, RECOMMEND RECOLLECT TO VERIFY   Potassium  3.5 - 5.1 mmol/L    QUESTIONABLE RESULTS, RECOMMEND RECOLLECT TO VERIFY   Chloride  101 - 111 mmol/L    QUESTIONABLE RESULTS, RECOMMEND  RECOLLECT TO VERIFY   CO2  22 - 32 mmol/L    QUESTIONABLE RESULTS, RECOMMEND RECOLLECT TO VERIFY   Glucose, Bld  65 - 99 mg/dL    QUESTIONABLE RESULTS, RECOMMEND RECOLLECT TO VERIFY   BUN  6 - 20 mg/dL    QUESTIONABLE RESULTS, RECOMMEND RECOLLECT TO VERIFY   Creatinine, Ser  0.44 - 1.00 mg/dL    QUESTIONABLE RESULTS, RECOMMEND RECOLLECT TO VERIFY   Calcium  8.9 - 10.3 mg/dL    QUESTIONABLE RESULTS, RECOMMEND RECOLLECT TO VERIFY   GFR calc non Af Amer NOT CALCULATED >60 mL/min   GFR calc Af Amer NOT CALCULATED >60 mL/min   Anion gap  5 - 15    QUESTIONABLE RESULTS, RECOMMEND RECOLLECT TO VERIFY    Studies/Results: No results found.    Assessment: Reported hematemesis, EGD shows grade 2 esophageal varices with no stigmata of hemorrhage no old or fresh blood anywhere in the upper GI tract, no endoscopic therapy performed  Plan: Clear liquid diet Monitor stools and hemoglobin Probably discontinue octreotide tomorrow Continue IV ranitidine. Continue Harvoni    Ellerie Arenz C 02/24/2016, 9:39 AM  Pager [phone] If no answer or after 5 PM call [phone]

## 2016-02-24 NOTE — Op Note (Signed)
[facility] Patient Name: Elender Mcmurry Procedure Date: 02/24/2016 MRN: [id number] Attending MD: Missy Sabins , MD Date of Birth: 14-Jul-1946 CSN: [id number] Age: 70 Admit Type: Inpatient Procedure:                Upper GI endoscopy Indications:              Hematemesis Providers:                Elyse Jarvis. Amedeo Plenty, MD, Dortha Schwalbe, RN, Corliss Parish, Technician Referring MD:              Medicines:                Fentanyl 25 micrograms IV, Midazolam 6 mg IV,                            Diphenhydramine 25 mg IV Complications:            No immediate complications. Estimated Blood Loss:     Estimated blood loss: none. Procedure:                Pre-Anesthesia Assessment:                           - Prior to the procedure, a History and Physical                            was performed, and patient medications and                            allergies were reviewed. The patient's tolerance of                            previous anesthesia was also reviewed. The risks                            and benefits of the procedure and the sedation                            options and risks were discussed with the patient.                            All questions were answered, and informed consent                            was obtained. Prior Anticoagulants: The patient has                            taken no previous anticoagulant or antiplatelet                            agents. ASA Grade Assessment: III - A patient with  severe systemic disease. After reviewing the risks                            and benefits, the patient was deemed in                            satisfactory condition to undergo the procedure.                           After obtaining informed consent, the endoscope was                            passed under direct vision. Throughout the                            procedure, the patient's blood pressure,  pulse, and                            oxygen saturations were monitored continuously. The                            [id number]) scope was introduced through the                            mouth, and advanced to the second part of duodenum.                            The upper GI endoscopy was accomplished without                            difficulty. The patient tolerated the procedure                            well. Scope In: Scope Out: Findings:      Non-bleeding grade II varices were found in the middle third of the       esophagus and in the lower third of the esophagus,. No stigmata of       recent bleeding were evident and no red wale signs were present.      A small hiatal hernia was present.      The examined duodenum was normal.      There is no endoscopic evidence of bleeding, varices or fluid       collections in the entire examined stomach.      The entire examined stomach was normal. Impression:               - Non-bleeding grade II esophageal varices.                           - Small hiatal hernia.                           - Normal examined duodenum.                           - Normal stomach.                           -  No specimens collected. Moderate Sedation:      Moderate (conscious) sedation was administered by the endoscopy nurse       and supervised by the endoscopist. The following parameters were       monitored: oxygen saturation, heart rate, blood pressure, respiratory       rate, EKG, adequacy of pulmonary ventilation, and response to care. Recommendation:           - Clear liquid diet.                           - Continue present medications. Procedure Code(s):        --- Professional ---                           [phone], Esophagogastroduodenoscopy, flexible,                            transoral; diagnostic, including collection of                            specimen(s) by brushing or washing, when performed                            (separate  procedure) Diagnosis Code(s):        --- Professional ---                           I85.00, Esophageal varices without bleeding                           K44.9, Diaphragmatic hernia without obstruction or                            gangrene                           K92.0, Hematemesis CPT copyright 2016 [employer]. All rights reserved. The codes documented in this report are preliminary and upon coder review may  be revised to meet current compliance requirements. Missy Sabins, MD 02/24/2016 9:30:09 AM This report has been signed electronically. Number of Addenda: 0

## 2016-02-24 NOTE — Progress Notes (Signed)
Despite patient receiving 4 liters in fluid boluses, BP remains hypotensive and slightly bradycardic. Patient is asymptomatic.  Pt is &Ox4. MD updated and aware. Labs collected. EGD to be preformed. Will continue to monitor.

## 2016-02-24 NOTE — Progress Notes (Signed)
Spoke with Dr Amedeo Plenty about pts consistently low BP overnight; stated that we would do procedure bedside today

## 2016-02-24 NOTE — Progress Notes (Addendum)
Pt extremely hypotensive, bradycardic and slightly lethargic after EGD. Manual pressures taken unsuccessfully by three different RNs. Radial pulse weak and thready. 1 liter bolus and labs to be given per MD. MD updated and notified. Will continue to monitor.

## 2016-02-24 NOTE — Progress Notes (Signed)
PROGRESS NOTE    Stacy Richard  [id number] DOB: June 05, 1946 DOA: 02/23/2016  PCP: Chesley Noon, MD   Brief Narrative:  Stacy Richard is a 70 y.o. 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 She was discharged with Nadolol and Pepcid as PPI is contraindicated with Harvoni. She has had 4 episodes of bloody vomiting. No abdominal pain and no stool today.  Subjective: One bloody stool yesterday. None today. No hematemesis.   Assessment & Plan:   Principal Problem:   Hematemesis - EGD this AM shows no bleeding from, Grade 2 varices - cont Octreotide and Pepcid - clear liquids  Active Problems: Anemia due to acute blood loss - f/u Hb Q 12- transfuse if < 8  Hepatitis C with cirrhosis - cont medications- 1 month into treatment   Type 2 diabetes mellitus  - ISS while here - normally diet controlled with HbA1c of 6.4   DVT prophylaxis: SCDs Code Status: full code Family Communication:  Disposition Plan: home in 1-2 days Consultants:   GI Procedures:   EGD today Antimicrobials:  Anti-infectives    Start     Dose/Rate Route Frequency Ordered Stop   02/24/16 0030  Ledipasvir-Sofosbuvir 90-400 MG TABS 1 tablet     1 tablet Oral Daily 02/24/16 0019         Objective: Filed Vitals:   02/24/16 0930 02/24/16 0935 02/24/16 1000 02/24/16 1015  BP:  80/29 78/33 83/38   Pulse:      Temp:      TempSrc:      Resp: 25 21 20 19   Height:      Weight:      SpO2: 98% 100% 100% 100%    Intake/Output Summary (Last 24 hours) at 02/24/16 1028 Last data filed at 02/24/16 1000  Gross per 24 hour  Intake 5077.09 ml  Output      0 ml  Net 5077.09 ml   Filed Weights   02/23/16 1438  Weight: 79.379 kg (175 lb)     Examination: General exam: Appears comfortable  HEENT: PERRLA, oral mucosa moist, no sclera icterus or thrush Respiratory system: Clear to auscultation. Respiratory effort normal. Cardiovascular system: S1 & S2 heard, RRR.  No murmurs  Gastrointestinal system: Abdomen soft, non-tender, nondistended. Normal bowel sound. No organomegaly Central nervous system: Alert and oriented. No focal neurological deficits. Extremities: No cyanosis, clubbing or edema Skin: No rashes or ulcers Psychiatry:  Mood & affect appropriate.     Data Reviewed: I have personally reviewed following labs and imaging studies  CBC:  Recent Labs Lab 02/23/16 1451 02/23/16 1816 02/23/16 2244 02/24/16 0933  WBC 10.1 12.9* 11.5* 9.5  NEUTROABS 7.0  --   --   --   HGB 11.0* 11.1* 9.2* 8.3*  HCT 33.0* 34.4* 28.3* 25.3*  MCV 97.9 97.2 95.0 95.5  PLT 158 155 123* [id number]*   Basic Metabolic Panel:  Recent Labs Lab 02/23/16 1451 02/24/16 0453  NA 141 QUESTIONABLE RESULTS, RECOMMEND RECOLLECT TO VERIFY  K 3.8 QUESTIONABLE RESULTS, RECOMMEND RECOLLECT TO VERIFY  CL 106 QUESTIONABLE RESULTS, RECOMMEND RECOLLECT TO VERIFY  CO2 24 QUESTIONABLE RESULTS, RECOMMEND RECOLLECT TO VERIFY  GLUCOSE 195* QUESTIONABLE RESULTS, RECOMMEND RECOLLECT TO VERIFY  BUN 34* QUESTIONABLE RESULTS, RECOMMEND RECOLLECT TO VERIFY  CREATININE 0.91 QUESTIONABLE RESULTS, RECOMMEND  RECOLLECT TO VERIFY  CALCIUM 8.1* QUESTIONABLE RESULTS, RECOMMEND RECOLLECT TO VERIFY   GFR: CrCl cannot be calculated (Patient has no serum creatinine result on file.). Liver Function Tests:  Recent Labs Lab 02/23/16 1451  AST 39  ALT 23  ALKPHOS 108  BILITOT 1.2  PROT 7.6  ALBUMIN 2.9*   No results for input(s): LIPASE, AMYLASE in the last 168 hours. No results for input(s): AMMONIA in the last 168 hours. Coagulation Profile:  Recent Labs Lab 02/23/16 1451  INR 1.42   Cardiac Enzymes: No results for input(s): CKTOTAL, CKMB, CKMBINDEX,  TROPONINI in the last 168 hours. BNP (last 3 results) No results for input(s): PROBNP in the last 8760 hours. HbA1C: No results for input(s): HGBA1C in the last 72 hours. CBG:  Recent Labs Lab 02/23/16 1437  GLUCAP 188*   Lipid Profile: No results for input(s): CHOL, HDL, LDLCALC, TRIG, CHOLHDL, LDLDIRECT in the last 72 hours. Thyroid Function Tests: No results for input(s): TSH, T4TOTAL, FREET4, T3FREE, THYROIDAB in the last 72 hours. Anemia Panel: No results for input(s): VITAMINB12, FOLATE, FERRITIN, TIBC, IRON, RETICCTPCT in the last 72 hours. Urine analysis: No results found for: COLORURINE, APPEARANCEUR, LABSPEC, PHURINE, GLUCOSEU, HGBUR, BILIRUBINUR, KETONESUR, PROTEINUR, UROBILINOGEN, NITRITE, LEUKOCYTESUR Sepsis Labs: @LABRCNTIP (procalcitonin:4,lacticidven:4) ) Recent Results (from the past 240 hour(s))  MRSA PCR Screening     Status: None   Collection Time: 02/23/16  5:50 PM  Result Value Ref Range Status   MRSA by PCR NEGATIVE NEGATIVE Final    Comment:        The GeneXpert MRSA Assay (FDA approved for NASAL specimens only), is one component of a comprehensive MRSA colonization surveillance program. It is not intended to diagnose MRSA infection nor to guide or monitor treatment for MRSA infections.          Radiology Studies: No results found.    Scheduled Meds: . busPIRone  7.5 mg Oral Daily  . DULoxetine  60 mg Oral Daily  . lactulose  10 g Oral BID  . Ledipasvir-Sofosbuvir  1 tablet Oral Daily  . loratadine  10 mg Oral Daily  . nadolol  20 mg Oral Daily  . oxybutynin  5 mg Oral Daily   Continuous Infusions: . 0.9 % NaCl with KCl 20 mEq / L 125 mL/hr at 02/24/16 0547  . octreotide  (SANDOSTATIN)    IV infusion 50 mcg/hr (02/24/16 0905)     LOS: 1 day    Time spent in minutes: 82    [place], MD [facility] Pager: [email or web address] Password [facility] 02/24/2016, 10:28 AM

## 2016-02-25 LAB — CBC
HEMATOCRIT: 30.2 % — AB (ref 36.0–46.0)
Hemoglobin: 9.5 g/dL — ABNORMAL LOW (ref 12.0–15.0)
MCH: 31.6 pg (ref 26.0–34.0)
MCHC: 31.5 g/dL (ref 30.0–36.0)
MCV: 100.3 fL — AB (ref 78.0–100.0)
Platelets: 148 10*3/uL — ABNORMAL LOW (ref 150–400)
RBC: 3.01 MIL/uL — AB (ref 3.87–5.11)
RDW: 15.3 % (ref 11.5–15.5)
WBC: 8.9 10*3/uL (ref 4.0–10.5)

## 2016-02-25 NOTE — Progress Notes (Signed)
PROGRESS NOTE    Stacy Richard  [id number] DOB: 1945-12-29 DOA: 02/23/2016  PCP: Chesley Noon, MD   Brief Narrative:  Stacy Richard is a 70 y.o. 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 She was discharged with Nadolol and Pepcid as PPI is contraindicated with Harvoni. She has had 4 episodes of bloody vomiting. No abdominal pain and no stool today.  Subjective: No bloody stools, nausea or vomiting.   Assessment & Plan:   Principal Problem:   Hematemesis - EGD shows no bleeding from, Grade 2 varices - d/c Octreotide - cont Pepcid - advance diet - transfer out of SDU  Active Problems: Anemia due to acute blood loss - f/u Hb tomorrow - transfuse if < 8  Hypotension - due to Versed yesterday -lactic acid was normal despite SBP in 70s and MAPS in 40s-  improved today - stopped IVF  Hepatitis C with cirrhosis - cont medications- 1 month into treatment   Type 2 diabetes mellitus  - ISS while here - normally diet controlled with HbA1c of 6.4   DVT prophylaxis: SCDs Code Status: full code Family Communication:  Disposition Plan: home in 1-2 days Consultants:   GI Procedures:   EGD    Antimicrobials:  Anti-infectives    Start     Dose/Rate Route Frequency Ordered Stop   02/24/16 0030  Ledipasvir-Sofosbuvir 90-400 MG TABS 1 tablet     1 tablet Oral Daily 02/24/16 0019         Objective: Filed Vitals:   02/25/16 0437 02/25/16 0600 02/25/16 0800 02/25/16 1026  BP:  116/68 123/61 153/73  Pulse:    50  Temp: 98.7 F (37.1 C)  98.7 F (37.1 C) 97.7 F (36.5 C)  TempSrc: Oral  Oral Oral  Resp:  20 13 14   Height:      Weight:      SpO2:  96% 97% 100%    Intake/Output Summary (Last 24 hours) at 02/25/16  1430 Last data filed at 02/25/16 0800  Gross per 24 hour  Intake   2725 ml  Output    625 ml  Net   2100 ml   Filed Weights   02/23/16 1438  Weight: 79.379 kg (175 lb)    Examination: General exam: Appears comfortable  HEENT: PERRLA, oral mucosa moist, no sclera icterus or thrush Respiratory system: Clear to auscultation. Respiratory effort normal. Cardiovascular system: S1 & S2 heard, RRR.  No murmurs  Gastrointestinal system: Abdomen soft, non-tender, nondistended. Normal bowel sound. No organomegaly Central nervous system: Alert and oriented. No focal neurological deficits. Extremities: No cyanosis, clubbing or edema Skin: No rashes or ulcers Psychiatry:  Mood & affect appropriate.     Data Reviewed: I have personally reviewed following labs and imaging studies  CBC:  Recent Labs Lab 02/23/16 1451 02/23/16 1816 02/23/16 2244 02/24/16 0933 02/24/16 2313 02/25/16 1036  WBC 10.1 12.9* 11.5* 9.5 7.8 8.9  NEUTROABS 7.0  --   --   --   --   --   HGB 11.0* 11.1* 9.2* 8.3* 8.4* 9.5*  HCT 33.0* 34.4* 28.3* 25.3* 25.7* 30.2*  MCV 97.9 97.2 95.0 95.5 97.0 100.3*  PLT 158 155 123* 110* 99* [id number]*   Basic Metabolic Panel:  Recent Labs Lab 02/23/16 1451 02/24/16 0453  02/24/16 0933  NA 141 QUESTIONABLE RESULTS, RECOMMEND RECOLLECT TO VERIFY 140  K 3.8 QUESTIONABLE RESULTS, RECOMMEND RECOLLECT TO VERIFY 4.8  CL 106 QUESTIONABLE RESULTS, RECOMMEND RECOLLECT TO VERIFY 117*  CO2 24 QUESTIONABLE RESULTS, RECOMMEND RECOLLECT TO VERIFY 20*  GLUCOSE 195* QUESTIONABLE RESULTS, RECOMMEND RECOLLECT TO VERIFY 107*  BUN 34* QUESTIONABLE RESULTS, RECOMMEND RECOLLECT TO VERIFY 19  CREATININE 0.91 QUESTIONABLE RESULTS, RECOMMEND RECOLLECT TO VERIFY 0.53  CALCIUM 8.1* QUESTIONABLE RESULTS, RECOMMEND RECOLLECT TO VERIFY 6.4*   GFR: Estimated Creatinine Clearance: 61.3 mL/min (by C-G formula based on Cr of 0.53). Liver Function Tests:  Recent Labs Lab 02/23/16 1451  AST 39  ALT 23   ALKPHOS 108  BILITOT 1.2  PROT 7.6  ALBUMIN 2.9*   No results for input(s): LIPASE, AMYLASE in the last 168 hours. No results for input(s): AMMONIA in the last 168 hours. Coagulation Profile:  Recent Labs Lab 02/23/16 1451  INR 1.42   Cardiac Enzymes: No results for input(s): CKTOTAL, CKMB, CKMBINDEX, TROPONINI in the last 168 hours. BNP (last 3 results) No results for input(s): PROBNP in the last 8760 hours. HbA1C: No results for input(s): HGBA1C in the last 72 hours. CBG:  Recent Labs Lab 02/23/16 1437  GLUCAP 188*   Lipid Profile: No results for input(s): CHOL, HDL, LDLCALC, TRIG, CHOLHDL, LDLDIRECT in the last 72 hours. Thyroid Function Tests: No results for input(s): TSH, T4TOTAL, FREET4, T3FREE, THYROIDAB in the last 72 hours. Anemia Panel: No results for input(s): VITAMINB12, FOLATE, FERRITIN, TIBC, IRON, RETICCTPCT in the last 72 hours. Urine analysis: No results found for: COLORURINE, APPEARANCEUR, LABSPEC, [place], GLUCOSEU, HGBUR, BILIRUBINUR, KETONESUR, PROTEINUR, UROBILINOGEN, NITRITE, LEUKOCYTESUR Sepsis Labs: @LABRCNTIP (procalcitonin:4,lacticidven:4) ) Recent Results (from the past 240 hour(s))  MRSA PCR Screening     Status: None   Collection Time: 02/23/16  5:50 PM  Result Value Ref Range Status   MRSA by PCR NEGATIVE NEGATIVE Final    Comment:        The GeneXpert MRSA Assay (FDA approved for NASAL specimens only), is one component of a comprehensive MRSA colonization surveillance program. It is not intended to diagnose MRSA infection nor to guide or monitor treatment for MRSA infections.          Radiology Studies: No results found.    Scheduled Meds: . busPIRone  7.5 mg Oral Daily  . DULoxetine  60 mg Oral Daily  . lactulose  10 g Oral BID  . Ledipasvir-Sofosbuvir  1 tablet Oral Daily  . loratadine  10 mg Oral Daily  . nadolol  20 mg Oral Daily  . oxybutynin  5 mg Oral Daily   Continuous Infusions:     LOS: 2 days     Time spent in minutes: [place], MD [facility] Pager: [email or web address] Password [email or web address] 02/25/2016, 2:30 PM

## 2016-02-25 NOTE — Progress Notes (Signed)
[facility] Progress Note  Subjective: No further emesis, normal-appearing stool yesterday. No GI complaints.  Objective: Vital signs in last 24 hours: Temp:  [97.7 F (36.5 C)-98.7 F (37.1 C)] 97.7 F (36.5 C) (06/18 1026) Pulse Rate:  [50] 50 (06/18 1026) Resp:  [13-23] 14 (06/18 1026) BP: (64-153)/(24-73) 153/73 mmHg (06/18 1026) SpO2:  [95 %-100 %] 100 % (06/18 1026) Weight change:    PE: Unchanged  Lab Results: Results for orders placed or performed during the hospital encounter of 02/23/16 (from the past 24 hour(s))  Lactic acid, plasma     Status: None   Collection Time: 02/24/16 11:00 AM  Result Value Ref Range   Lactic Acid, Venous 1.8 0.5 - 2.0 mmol/L  CBC     Status: Abnormal   Collection Time: 02/24/16 11:13 PM  Result Value Ref Range   WBC 7.8 4.0 - 10.5 K/uL   RBC 2.65 (L) 3.87 - 5.11 MIL/uL   Hemoglobin 8.4 (L) 12.0 - 15.0 g/dL   HCT 25.7 (L) 36.0 - 46.0 %   MCV 97.0 78.0 - 100.0 fL   MCH 31.7 26.0 - 34.0 pg   MCHC 32.7 30.0 - 36.0 g/dL   RDW 15.4 11.5 - 15.5 %   Platelets 99 (L) 150 - 400 K/uL    Studies/Results: No results found.    Assessment: Unwitnessed hematemesis, grade 2 varices on EGD with no stigmata of hemorrhage Hepatitis C  Plan: Discontinue octreotide Continue Zantac Advance diet Continue Harvoni Possibly home tomorrow    Stacy Richard C 02/25/2016, 10:39 AM  Pager [phone] If no answer or after 5 PM call [phone]

## 2016-02-26 ENCOUNTER — Inpatient Hospital Stay: Admission: RE | Admit: 2016-02-26 | Payer: Self-pay | Source: Ambulatory Visit

## 2016-02-26 ENCOUNTER — Encounter (HOSPITAL_COMMUNITY): Payer: Self-pay | Admitting: Gastroenterology

## 2016-02-26 DIAGNOSIS — D62 Acute posthemorrhagic anemia: Secondary | ICD-10-CM

## 2016-02-26 LAB — CBC
HEMATOCRIT: 25.7 % — AB (ref 36.0–46.0)
HEMOGLOBIN: 8.4 g/dL — AB (ref 12.0–15.0)
MCH: 31.3 pg (ref 26.0–34.0)
MCHC: 32.7 g/dL (ref 30.0–36.0)
MCV: 95.9 fL (ref 78.0–100.0)
Platelets: 103 10*3/uL — ABNORMAL LOW (ref 150–400)
RBC: 2.68 MIL/uL — ABNORMAL LOW (ref 3.87–5.11)
RDW: 15 % (ref 11.5–15.5)
WBC: 5.8 10*3/uL (ref 4.0–10.5)

## 2016-02-26 MED ORDER — NADOLOL 20 MG PO TABS: 20.0000 mg | ORAL_TABLET | Freq: Every day | ORAL | Status: AC

## 2016-02-26 NOTE — Care Management Note (Signed)
Case Management Note  Patient Details  Name: Stacy Richard MRN: [id number] Date of Birth: 21-Jun-1946  Subjective/Objective:                    Action/Plan:d/c home no needs or orders.   Expected Discharge Date:   (unknown)               Expected Discharge Plan:  Home/Self Care  In-House Referral:     Discharge planning Services  CM Consult  Post Acute Care Choice:    Choice offered to:     DME Arranged:    DME Agency:     HH Arranged:    [place] Agency:     Status of Service:  Completed, signed off  Medicare Important Message Given:    Date Medicare IM Given:    Medicare IM give by:    Date Additional Medicare IM Given:    Additional Medicare Important Message give by:     If discussed at [place] of Stay Meetings, dates discussed:    Additional Comments:  Dessa Phi, RN 02/26/2016, 10:02 AM

## 2016-02-26 NOTE — Care Management Note (Signed)
Case Management Note  Patient Details  Name: Stacy Richard MRN: [id number] Date of Birth: 05-Dec-1945  Subjective/Objective: home rw ordered. [facility] dme rep Jermaine aware to bring to rm. No further CM needs.                   Action/Plan:d/c home home rw.   Expected Discharge Date:   (unknown)               Expected Discharge Plan:  Home/Self Care  In-House Referral:     Discharge planning Services  CM Consult  Post Acute Care Choice:    Choice offered to:  Patient  DME Arranged:  [place] rolling DME Agency:  [place]:    [place]:     Status of Service:  Completed, signed off  Medicare Important Message Given:    Date Medicare IM Given:    Medicare IM give by:    Date Additional Medicare IM Given:    Additional Medicare Important Message give by:     If discussed at [place] of Stay Meetings, dates discussed:    Additional Comments:  Dessa Phi, RN 02/26/2016, 12:56 PM

## 2016-02-26 NOTE — Progress Notes (Signed)
[facility] Progress Note  Subjective: The patient states she is feeling well. She denies any further gastrointestinal bleeding. She is eating. She denies abdominal pain.  Objective: Vital signs in last 24 hours: Temp:  [97.7 F (36.5 C)-99.2 F (37.3 C)] 99.1 F (37.3 C) (06/19 0601) Pulse Rate:  [50-58] 58 (06/19 0601) Resp:  [14-18] 17 (06/19 0601) BP: (121-153)/(56-98) 131/56 mmHg (06/19 0601) SpO2:  [99 %-100 %] 99 % (06/19 0601) Weight change:    PE:  She is in no distress  Heart regular rhythm  Lungs clear  Abdomen soft and nontender  Lab Results: Results for orders placed or performed during the hospital encounter of 02/23/16 (from the past 24 hour(s))  CBC     Status: Abnormal   Collection Time: 02/25/16 10:36 AM  Result Value Ref Range   WBC 8.9 4.0 - 10.5 K/uL   RBC 3.01 (L) 3.87 - 5.11 MIL/uL   Hemoglobin 9.5 (L) 12.0 - 15.0 g/dL   HCT 30.2 (L) 36.0 - 46.0 %   MCV 100.3 (H) 78.0 - 100.0 fL   MCH 31.6 26.0 - 34.0 pg   MCHC 31.5 30.0 - 36.0 g/dL   RDW 15.3 11.5 - 15.5 %   Platelets 148 (L) 150 - 400 K/uL  CBC     Status: Abnormal   Collection Time: 02/26/16  5:15 AM  Result Value Ref Range   WBC 5.8 4.0 - 10.5 K/uL   RBC 2.68 (L) 3.87 - 5.11 MIL/uL   Hemoglobin 8.4 (L) 12.0 - 15.0 g/dL   HCT 25.7 (L) 36.0 - 46.0 %   MCV 95.9 78.0 - 100.0 fL   MCH 31.3 26.0 - 34.0 pg   MCHC 32.7 30.0 - 36.0 g/dL   RDW 15.0 11.5 - 15.5 %   Platelets 103 (L) 150 - 400 K/uL    Studies/Results: No results found.    Assessment: History of hepatitis C  Recent hematemesis  Nonbleeding esophageal varices and gastropathy  Plan:   Continue current medications and observation. She appears clinically stable. She could probably go home and follow-up with her PCP.    Cassell Clement 02/26/2016, 8:40 AM  Pager: [phone] If no answer or after 5 PM call [phone] Lab Results  Component Value Date   HGB 8.4* 02/26/2016   HGB 9.5* 02/25/2016   HGB  8.4* 02/24/2016   HCT 25.7* 02/26/2016   HCT 30.2* 02/25/2016   HCT 25.7* 02/24/2016   ALKPHOS 108 02/23/2016   ALKPHOS 106 01/25/2016   ALKPHOS 117 01/24/2016   AST 39 02/23/2016   AST 37 01/25/2016   AST 42* 01/24/2016   ALT 23 02/23/2016   ALT 26 01/25/2016   ALT 27 01/24/2016

## 2016-02-26 NOTE — Discharge Summary (Signed)
Physician Discharge Summary  Stacy Richard [id number] DOB: 1945-11-21 DOA: 02/23/2016  PCP: Chesley Noon, MD  Admit date: 02/23/2016 Discharge date: 02/26/2016  Time spent: 60 minutes  Recommendations for Outpatient Follow-up:  1. F/u Hb intermittently  Discharge Condition: stable    Discharge Diagnoses:  Principal Problem:   Hematemesis Active Problems:   Acute blood loss anemia   Hepatitis C   Type 2 diabetes mellitus (HCC)   Hepatic cirrhosis (HCC)   Esophageal varices (HCC)   Portal hypertensive gastropathy   History of present illness:  Stacy Richard is a 70 y.o. 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 She was discharged with Nadolol and Pepcid as PPI is contraindicated with Harvoni. She has had 4 episodes of bloody vomiting. No abdominal pain and no stool today.  Hospital Course:  Principal Problem:  Hematemesis - EGD once again shows no bleeding from Grade 2 varices- no banding done - advancde diet- no further bleeding - GI recommended discharge and f/u with PCP  Active Problems: Anemia due to acute blood loss   - Hb 11 on admission >> 8.4 for the past 3 days  Chronic thrombocytopenia  Hypotension - due to Versed from EGD - lactic acid was normal despite SBP in 70s and MAPS in 40s- improved   Hepatitis C with cirrhosis - cont medications- 1 month into treatment   Type 2 diabetes mellitus  - ISS while here - normally diet controlled with HbA1c of 6.4   Procedures:  EGD  Consultations:  GI  Discharge Exam: Filed Weights   02/23/16 1438  Weight: 79.379 kg (175 lb)   Filed Vitals:   02/25/16 2027 02/26/16 0601  BP: 121/98 131/56  Pulse: 57 58  Temp: 99.2 F (37.3 C) 99.1 F (37.3 C)  Resp:  18 17    General: AAO x 3, no distress Cardiovascular: RRR, no murmurs  Respiratory: clear to auscultation bilaterally GI: soft, non-tender, non-distended, bowel sound positive  Discharge Instructions You were cared for by a hospitalist during your hospital stay. If you have any questions about your discharge medications or the care you received while you were in the hospital after you are discharged, you can call the unit and asked to speak with the hospitalist on call if the hospitalist that took care of you is not available. Once you are discharged, your primary care physician will handle any further medical issues. Please note that NO REFILLS for any discharge medications will be authorized once you are discharged, as it is imperative that you return to your primary care physician (or establish a relationship with a primary care physician if you do not have one) for your aftercare needs so that they can reassess your need for medications and monitor your lab values.      Discharge Instructions    Diet - low sodium heart healthy    Complete by:  As directed      Increase activity slowly    Complete by:  As directed             Medication List    TAKE these medications        artificial tears ointment  Place 1 application into both eyes as needed.     busPIRone 7.5 MG tablet  Commonly  known as:  BUSPAR  Take 1 tablet (7.5 mg total) by mouth daily.     calcium carbonate 500 MG chewable tablet  Commonly known as:  TUMS - dosed in mg elemental calcium  Chew 2 tablets by mouth 3 (three) times daily as needed for indigestion or heartburn.     CALTRATE 600+D 600-800 MG-UNIT Tabs  Generic drug:  Calcium Carb-Cholecalciferol  Take 1 tablet by mouth 2 (two) times daily.     DULoxetine 60 MG capsule  Commonly known as:  CYMBALTA  Take 1 capsule (60 mg total) by mouth daily.     famotidine 40 MG tablet  Commonly known as:  PEPCID  Take 1 tablet (40 mg total) by mouth 2 (two)  times daily as needed for heartburn or indigestion.     HARVONI 90-400 MG Tabs  Generic drug:  Ledipasvir-Sofosbuvir  Take 1 tablet by mouth daily.     lactulose 10 GM/15ML solution  Commonly known as:  CHRONULAC  Take 10 g by mouth 2 (two) times daily.     loratadine 10 MG tablet  Commonly known as:  CLARITIN  Take 10 mg by mouth daily.     multivitamin with minerals Tabs tablet  Take 1 tablet by mouth daily. Multivitamin with lutein and lycopene     nadolol 20 MG tablet  Commonly known as:  CORGARD  Take 1 tablet (20 mg total) by mouth daily.     oxybutynin 5 MG 24 hr tablet  Commonly known as:  DITROPAN-XL  Take 5 mg by mouth daily.     SYSTANE OP  Apply 1 drop to eye daily as needed (dry eye).       No Known Allergies Follow-up Information    Follow up with [facility].   Why:  home rw.   Contact information:   [address] [place] [address] [phone]        The results of significant diagnostics from this hospitalization (including imaging, microbiology, ancillary and laboratory) are listed below for reference.    Significant Diagnostic Studies: No results found.  Microbiology: Recent Results (from the past 240 hour(s))  MRSA PCR Screening     Status: None   Collection Time: 02/23/16  5:50 PM  Result Value Ref Range Status   MRSA by PCR NEGATIVE NEGATIVE Final    Comment:        The GeneXpert MRSA Assay (FDA approved for NASAL specimens only), is one component of a comprehensive MRSA colonization surveillance program. It is not intended to diagnose MRSA infection nor to guide or monitor treatment for MRSA infections.      Labs: Basic Metabolic Panel:  Recent Labs Lab 02/23/16 1451 02/24/16 0453 02/24/16 0933  NA 141 QUESTIONABLE RESULTS, RECOMMEND RECOLLECT TO VERIFY 140  K 3.8 QUESTIONABLE RESULTS, RECOMMEND RECOLLECT TO VERIFY 4.8  CL 106 QUESTIONABLE RESULTS, RECOMMEND RECOLLECT TO VERIFY 117*  CO2  24 QUESTIONABLE RESULTS, RECOMMEND RECOLLECT TO VERIFY 20*  GLUCOSE 195* QUESTIONABLE RESULTS, RECOMMEND RECOLLECT TO VERIFY 107*  BUN 34* QUESTIONABLE RESULTS, RECOMMEND RECOLLECT TO VERIFY 19  CREATININE 0.91 QUESTIONABLE RESULTS, RECOMMEND RECOLLECT TO VERIFY 0.53  CALCIUM 8.1* QUESTIONABLE RESULTS, RECOMMEND RECOLLECT TO VERIFY 6.4*   Liver Function Tests:  Recent Labs Lab 02/23/16 1451  AST 39  ALT 23  ALKPHOS 108  BILITOT 1.2  PROT 7.6  ALBUMIN 2.9*   No results for input(s): LIPASE, AMYLASE in the last 168 hours. No results for input(s): AMMONIA in  the last 168 hours. CBC:  Recent Labs Lab 02/23/16 1451  02/23/16 2244 02/24/16 0933 02/24/16 2313 02/25/16 1036 02/26/16 0515  WBC 10.1  < > 11.5* 9.5 7.8 8.9 5.8  NEUTROABS 7.0  --   --   --   --   --   --   HGB 11.0*  < > 9.2* 8.3* 8.4* 9.5* 8.4*  HCT 33.0*  < > 28.3* 25.3* 25.7* 30.2* 25.7*  MCV 97.9  < > 95.0 95.5 97.0 100.3* 95.9  PLT 158  < > 123* 110* 99* 148* 103*  < > = values in this interval not displayed. Cardiac Enzymes: No results for input(s): CKTOTAL, CKMB, CKMBINDEX, TROPONINI in the last 168 hours. BNP: BNP (last 3 results)  Recent Labs  01/25/16 0316  BNP 27.6    ProBNP (last 3 results) No results for input(s): PROBNP in the last 8760 hours.  CBG:  Recent Labs Lab 02/23/16 1437  GLUCAP 188*       SignedDebbe Odea, MD [facility] 02/26/2016, 1:12 PM

## 2016-02-27 ENCOUNTER — Ambulatory Visit (HOSPITAL_COMMUNITY): Payer: Self-pay | Admitting: Psychiatry

## 2016-03-05 ENCOUNTER — Inpatient Hospital Stay: Admission: RE | Admit: 2016-03-05 | Payer: Self-pay | Source: Ambulatory Visit
